# Patient Record
Sex: Female | Born: 1970 | Race: White | Hispanic: No | Marital: Single | Smoking: Never smoker
Health system: Southern US, Community
[De-identification: ages and names within clinical notes are randomized; demographics above are authoritative.]

## PROBLEM LIST (undated history)

## (undated) DIAGNOSIS — Z9889 Other specified postprocedural states: Secondary | ICD-10-CM

## (undated) DIAGNOSIS — I10 Essential (primary) hypertension: Secondary | ICD-10-CM

## (undated) DIAGNOSIS — R011 Cardiac murmur, unspecified: Secondary | ICD-10-CM

## (undated) DIAGNOSIS — C801 Malignant (primary) neoplasm, unspecified: Secondary | ICD-10-CM

## (undated) DIAGNOSIS — K219 Gastro-esophageal reflux disease without esophagitis: Secondary | ICD-10-CM

## (undated) DIAGNOSIS — J45909 Unspecified asthma, uncomplicated: Secondary | ICD-10-CM

## (undated) DIAGNOSIS — M797 Fibromyalgia: Secondary | ICD-10-CM

## (undated) DIAGNOSIS — R51 Headache: Secondary | ICD-10-CM

## (undated) DIAGNOSIS — M5126 Other intervertebral disc displacement, lumbar region: Secondary | ICD-10-CM

## (undated) DIAGNOSIS — R519 Headache, unspecified: Secondary | ICD-10-CM

## (undated) DIAGNOSIS — R112 Nausea with vomiting, unspecified: Secondary | ICD-10-CM

## (undated) DIAGNOSIS — G4730 Sleep apnea, unspecified: Secondary | ICD-10-CM

## (undated) HISTORY — PX: OTHER SURGICAL HISTORY: SHX169

## (undated) HISTORY — PX: TUBAL LIGATION: SHX77

## (undated) HISTORY — PX: COLONOSCOPY: SHX174

## (undated) HISTORY — PX: BACK SURGERY: SHX140

## (undated) HISTORY — PX: ABDOMINAL HYSTERECTOMY: SHX81

## (undated) HISTORY — PX: BILATERAL CARPAL TUNNEL RELEASE: SHX6508

---

## 2012-02-17 ENCOUNTER — Ambulatory Visit: Payer: Self-pay

## 2013-08-22 ENCOUNTER — Ambulatory Visit: Payer: Self-pay | Admitting: Family Medicine

## 2013-10-10 ENCOUNTER — Ambulatory Visit: Payer: Self-pay | Admitting: Physician Assistant

## 2014-02-22 ENCOUNTER — Ambulatory Visit: Payer: Self-pay | Admitting: Physician Assistant

## 2014-05-19 ENCOUNTER — Ambulatory Visit
Admit: 2014-05-19 | Disposition: A | Discharge status: Home / Self Care | Payer: Self-pay | Attending: Family Medicine | Admitting: Family Medicine

## 2015-04-16 ENCOUNTER — Other Ambulatory Visit: Payer: Self-pay | Admitting: Neurology

## 2015-04-16 DIAGNOSIS — M5116 Intervertebral disc disorders with radiculopathy, lumbar region: Secondary | ICD-10-CM

## 2015-04-29 ENCOUNTER — Ambulatory Visit
Admission: RE | Admit: 2015-04-29 | Discharge: 2015-04-29 | Disposition: A | Discharge status: Home / Self Care | Payer: 59 | Source: Ambulatory Visit | Attending: Neurology | Admitting: Neurology

## 2015-04-29 DIAGNOSIS — M5127 Other intervertebral disc displacement, lumbosacral region: Secondary | ICD-10-CM | POA: Insufficient documentation

## 2015-04-29 DIAGNOSIS — M5116 Intervertebral disc disorders with radiculopathy, lumbar region: Secondary | ICD-10-CM | POA: Insufficient documentation

## 2015-04-29 DIAGNOSIS — M545 Low back pain: Secondary | ICD-10-CM | POA: Diagnosis present

## 2015-05-28 ENCOUNTER — Other Ambulatory Visit: Payer: Self-pay | Admitting: Neurosurgery

## 2015-05-28 ENCOUNTER — Other Ambulatory Visit (HOSPITAL_COMMUNITY): Payer: Self-pay | Admitting: Neurosurgery

## 2015-05-28 DIAGNOSIS — M4802 Spinal stenosis, cervical region: Secondary | ICD-10-CM

## 2015-06-12 ENCOUNTER — Ambulatory Visit: Admission: RE | Admit: 2015-06-12 | Payer: 59 | Source: Ambulatory Visit

## 2015-06-12 ENCOUNTER — Ambulatory Visit: Payer: 59

## 2015-06-12 ENCOUNTER — Telehealth: Payer: Self-pay | Admitting: Radiology

## 2016-03-11 ENCOUNTER — Encounter: Payer: Self-pay | Admitting: Emergency Medicine

## 2016-03-11 ENCOUNTER — Ambulatory Visit
Admission: EM | Admit: 2016-03-11 | Discharge: 2016-03-11 | Disposition: A | Discharge status: Home / Self Care | Payer: 59 | Attending: Internal Medicine | Admitting: Internal Medicine

## 2016-03-11 DIAGNOSIS — J019 Acute sinusitis, unspecified: Secondary | ICD-10-CM | POA: Diagnosis not present

## 2016-03-11 DIAGNOSIS — J029 Acute pharyngitis, unspecified: Secondary | ICD-10-CM

## 2016-03-11 HISTORY — DX: Unspecified asthma, uncomplicated: J45.909

## 2016-03-11 HISTORY — DX: Other intervertebral disc displacement, lumbar region: M51.26

## 2016-03-11 HISTORY — DX: Essential (primary) hypertension: I10

## 2016-03-11 LAB — RAPID STREP SCREEN (MED CTR MEBANE ONLY): STREPTOCOCCUS, GROUP A SCREEN (DIRECT): POSITIVE — AB

## 2016-03-11 MED ORDER — CEPHALEXIN 500 MG PO CAPS: 500.0000 mg | ORAL_CAPSULE | Freq: Two times a day (BID) | ORAL | 0 refills | Status: DC

## 2016-03-11 MED ORDER — PREDNISONE 50 MG PO TABS: 50.0000 mg | ORAL_TABLET | Freq: Every day | ORAL | 0 refills | Status: DC

## 2016-03-11 NOTE — Discharge Instructions (Addendum)
Strep swab is pending.  We will contact you if a change in treatment is needed.  Recheck for new fever >100.5, increasing phlegm production/nasal discharge, or if not starting to improve in a few days.  Rest and push fluids.

## 2016-03-11 NOTE — ED Provider Notes (Signed)
[facility]    CSN: [id number] Arrival date & time: 03/11/16  1030     History   Chief Complaint Chief Complaint  Patient presents with  . Sore Throat    HPI Phyllis Park is a 46 y.o. female. Presents today with 4d hx sore throat, post nasal drainage.  Lots of sinus hx, takes mucinex, zyrtec, flonase.  Nose is not runny. No cough. Has chronic headaches. Not achy. No fever. No nausea/vomiting, no diarrhea.  HPI  Past Medical History:  Diagnosis Date  . Asthma   . Herniated lumbar intervertebral disc   . Hypertension     No past surgical history on file.    Home Medications    Prior to Admission medications   Medication Sig Start Date End Date Taking? Authorizing Provider  ALBUTEROL SULFATE HFA IN Inhale into the lungs.   Yes Historical Provider, MD  budesonide-formoterol (SYMBICORT) 160-4.5 MCG/ACT inhaler Inhale 2 puffs into the lungs 2 (two) times daily.   Yes Historical Provider, MD  buPROPion (WELLBUTRIN) 75 MG tablet Take 75 mg by mouth 2 (two) times daily.   Yes Historical Provider, MD  gabapentin (NEURONTIN) 300 MG capsule Take 300 mg by mouth 3 (three) times daily.   Yes Historical Provider, MD  lisinopril (PRINIVIL,ZESTRIL) 10 MG tablet Take 10 mg by mouth daily.   Yes Historical Provider, MD  predniSONE (DELTASONE) 50 MG tablet Take 1 tablet (50 mg total) by mouth daily. 03/11/16   Sherlene Shams, MD    Family History No family history on file.  Social History Social History  Substance Use Topics  . Smoking status: Never Smoker  . Smokeless tobacco: Never Used  . Alcohol use Yes     Comment: occassional     Allergies   Erythromycin; Morphine and related; and Penicillins   Review of Systems Review of Systems  All other systems reviewed and are negative.    Physical Exam Triage Vital Signs ED Triage Vitals  Enc Vitals Group     BP 03/11/16 1056 119/78     Pulse Rate 03/11/16 1056 (!) 101     Resp 03/11/16 1056 16   Temp 03/11/16 1056 98.4 F (36.9 C)     Temp Source 03/11/16 1056 Oral     SpO2 03/11/16 1056 100 %     Weight 03/11/16 1056 135 lb (61.2 kg)     Height 03/11/16 1056 5\' 1"  (1.549 m)     Pain Score 03/11/16 1100 4     Pain Loc --    Updated Vital Signs BP 119/78 (BP Location: Left Arm)   Pulse (!) 101   Temp 98.4 F (36.9 C) (Oral)   Resp 16   Ht 5\' 1"  (1.549 m)   Wt 135 lb (61.2 kg)   SpO2 100%   BMI 25.51 kg/m   Physical Exam  Constitutional: She is oriented to person, place, and time.  Alert, nicely groomed Looks tired, voice sounds congested  HENT:  Head: Atraumatic.  Bilateral TMs are moderately dull, left is red tinged Moderately severe nasal congestion with mucopurulent material present Upper posterior pharynx is somewhat swollen, tonsils are prominent, but pink  Eyes:  Conjugate gaze, no eye redness/drainage  Neck: Neck supple.  Cardiovascular: Normal rate and regular rhythm.   Pulmonary/Chest: No respiratory distress.  Lungs clear, symmetric breath sounds  Abdominal: She exhibits no distension.  Musculoskeletal: Normal range of motion.  No leg swelling  Neurological: She is alert and oriented to person,  place, and time.  Skin: Skin is warm and dry.  No cyanosis  Nursing note and vitals reviewed.    UC Treatments / Results  Labs Results for orders placed or performed during the hospital encounter of 03/11/16  Rapid strep screen  Result Value Ref Range   Streptococcus, Group A Screen (Direct) POSITIVE (A) NEGATIVE    Procedures Procedures (including critical care time) None today  Final Clinical Impressions(s) / UC Diagnoses   Final diagnoses:  Acute sinusitis, recurrence not specified, unspecified location  Acute pharyngitis, unspecified etiology   Strep swab is pending.  We will contact you if a change in treatment is needed.  Recheck for new fever >100.5, increasing phlegm production/nasal discharge, or if not starting to improve in a few  days.  Rest and push fluids.    **strep swab was positive and rx cephalexin sent to pharmacy.  Voicemail left on patient's confirmed phone number.    Meds ordered this encounter  . predniSONE (DELTASONE) 50 MG tablet    Sig: Take 1 tablet (50 mg total) by mouth daily.    Dispense:  3 tablet    Refill:  0  . cephALEXin (KEFLEX) 500 MG capsule    Sig: Take 1 capsule (500 mg total) by mouth 2 (two) times daily.    Dispense:  20 capsule    Refill:  0      Sherlene Shams, MD 03/12/16 2141

## 2016-03-11 NOTE — ED Triage Notes (Signed)
Pt reports sore throat with swelling denies known fever reports has had some sinus drainage

## 2016-05-01 ENCOUNTER — Encounter: Payer: Self-pay | Admitting: Gynecology

## 2016-05-01 ENCOUNTER — Ambulatory Visit
Admission: EM | Admit: 2016-05-01 | Discharge: 2016-05-01 | Disposition: A | Discharge status: Home / Self Care | Payer: 59 | Attending: Family Medicine | Admitting: Family Medicine

## 2016-05-01 DIAGNOSIS — J0140 Acute pansinusitis, unspecified: Secondary | ICD-10-CM

## 2016-05-01 MED ORDER — LEVOFLOXACIN 500 MG PO TABS: 500.0000 mg | ORAL_TABLET | Freq: Every day | ORAL | 0 refills | Status: AC

## 2016-05-01 NOTE — ED Provider Notes (Signed)
CSN: [id number]     Arrival date & time 05/01/16  1209 History   First MD Initiated Contact with Patient 05/01/16 1228     Chief Complaint  Patient presents with  . Sinusitis   (Consider location/radiation/quality/duration/timing/severity/associated sxs/prior Treatment) 46 year old female presents with nasal congestion, sinus pressure, headache, and fatigue for the past 4 to 5 days. Now experiencing more cough and congestion. Denies any fever or GI symptoms. Has history of recurrent allergies, asthma and sinus problems for many years. Was seen about 1 month ago with strep and took Keflex with success. She has been taking Mucinex, Nyquil and Zyrtec with minimal relief now.    The history is provided by the patient.    Past Medical History:  Diagnosis Date  . Asthma   . Herniated lumbar intervertebral disc   . Hypertension    Past Surgical History:  Procedure Laterality Date  . ABDOMINAL HYSTERECTOMY    . BILATERAL CARPAL TUNNEL RELEASE    . bladder tact     No family history on file. Social History  Substance Use Topics  . Smoking status: Never Smoker  . Smokeless tobacco: Never Used  . Alcohol use Yes     Comment: occassional   OB History    No data available     Review of Systems  Constitutional: Positive for fatigue. Negative for activity change, appetite change, chills and fever.  HENT: Positive for congestion, postnasal drip, sinus pain, sinus pressure and sore throat. Negative for ear discharge, ear pain and facial swelling.   Eyes: Negative for discharge.  Respiratory: Positive for cough. Negative for chest tightness, shortness of breath and wheezing.   Cardiovascular: Negative for chest pain.  Gastrointestinal: Negative for abdominal pain, diarrhea, nausea and vomiting.  Musculoskeletal: Negative for arthralgias, back pain, myalgias, neck pain and neck stiffness.  Skin: Negative for rash and wound.  Neurological: Positive for headaches. Negative for dizziness,  syncope, weakness and light-headedness.  Hematological: Negative for adenopathy.    Allergies  Erythromycin; Morphine and related; and Penicillins  Home Medications   Prior to Admission medications   Medication Sig Start Date End Date Taking? Authorizing Provider  ALBUTEROL SULFATE HFA IN Inhale into the lungs.   Yes Historical Provider, MD  budesonide-formoterol (SYMBICORT) 160-4.5 MCG/ACT inhaler Inhale 2 puffs into the lungs 2 (two) times daily.   Yes Historical Provider, MD  buPROPion (WELLBUTRIN) 75 MG tablet Take 75 mg by mouth 2 (two) times daily.   Yes Historical Provider, MD  gabapentin (NEURONTIN) 300 MG capsule Take 300 mg by mouth 3 (three) times daily.   Yes Historical Provider, MD  lisinopril (PRINIVIL,ZESTRIL) 10 MG tablet Take 10 mg by mouth daily.   Yes Historical Provider, MD  levofloxacin (LEVAQUIN) 500 MG tablet Take 1 tablet (500 mg total) by mouth daily. 05/01/16 05/08/16  Katy Apo, NP   Meds Ordered and Administered this Visit  Medications - No data to display  BP 123/75   Pulse (!) 103   Temp 98.5 F (36.9 C) (Oral)   Resp 16   Wt 135 lb (61.2 kg)   SpO2 97%   BMI 25.51 kg/m  No data found.   Physical Exam  Constitutional: She is oriented to person, place, and time. She appears well-developed and well-nourished. She appears ill. No distress.  Appears tired.  HENT:  Head: Normocephalic and atraumatic.  Right Ear: Hearing, tympanic membrane, external ear and ear canal normal.  Left Ear: Hearing, tympanic membrane, external ear and  ear canal normal.  Nose: Mucosal edema and rhinorrhea present. Right sinus exhibits maxillary sinus tenderness and frontal sinus tenderness. Left sinus exhibits maxillary sinus tenderness and frontal sinus tenderness.  Mouth/Throat: Uvula is midline and mucous membranes are normal. Posterior oropharyngeal erythema present.  Neck: Normal range of motion. Neck supple.  Cardiovascular: Regular rhythm and normal heart sounds.   Bradycardia present.   Pulmonary/Chest: Effort normal and breath sounds normal. No respiratory distress. She has no decreased breath sounds. She has no wheezes. She has no rhonchi.  Musculoskeletal: Normal range of motion.  Lymphadenopathy:    She has no cervical adenopathy.  Neurological: She is alert and oriented to person, place, and time.  Skin: Skin is warm and dry. Capillary refill takes less than 2 seconds.  Psychiatric: She has a normal mood and affect. Her behavior is normal. Judgment and thought content normal.    Urgent Care Course     Procedures (including critical care time)  Labs Review Labs Reviewed - No data to display  Imaging Review No results found.   Visual Acuity Review  Right Eye Distance:   Left Eye Distance:   Bilateral Distance:    Right Eye Near:   Left Eye Near:    Bilateral Near:         MDM   1. Acute non-recurrent pansinusitis    Discussed various treatment options with history of multiple allergies- recommend trial Levaquin 500mg  1 tablet daily. Continue Mucinex and Zyrtec as directed. Also continue Albuterol and Symbicort as needed for cough. Increase fluid intake to help loosen up mucus. Follow-up with her primary care provider in 3 to 4 days if not improving.      Katy Apo, NP 05/01/16 1324

## 2016-05-01 NOTE — Discharge Instructions (Signed)
Recommend start Levaquin daily as directed. Continue Mucinex and Zyrtec as well as your inhalers as needed. Increase fluid intake to help loosen up mucus. Follow-up with your primary care provider in 3 to 4 days if not improving.

## 2016-05-01 NOTE — ED Triage Notes (Signed)
Patient c/o sinus drainage x 4 days.

## 2016-06-23 ENCOUNTER — Other Ambulatory Visit: Payer: Self-pay | Admitting: Neurosurgery

## 2016-08-03 NOTE — Pre-Procedure Instructions (Signed)
    Antonieta Slaven  08/03/2016      [facility] [address] [place] [address] [facility] [place] [address] Phone: [phone] Fax: [phone]    Your procedure is scheduled on July 6th, Friday.              Report to [facility] Admitting at 6:00 AM             (posted surgery time 8:00 - 10:48am) .  Call this number if you have problems the morning of surgery:  [phone], for other questions, call [phone] Mon-Fri from 8a - 4p.   Remember:   Do not eat food or drink liquids after midnight Thursday.               4-5 days prior to surgery, STOP TAKING any Vitamins, Herbal Supplements, Anti-flammatories.   Take these medicines the morning of surgery with A SIP OF WATER : Nexium, Neurontin, Hydrocodone. Please use your inhalers that morning.   Do not wear jewelry, make-up or nail polish.  Do not wear lotions, powders,  perfumes, or deoderant.  Do not shave 48 hours prior to surgery.     Do not bring valuables to the hospital.  [facility] is not responsible for any belongings or valuables.  Contacts, dentures or bridgework may not be worn into surgery.  Leave your suitcase in the car.  After surgery it may be brought to your room.  For patients admitted to the hospital, discharge time will be determined by your treatment team.  Please read over the following fact sheets that you were given. Pain Booklet, MRSA Information and Surgical Site Infection Prevention

## 2016-08-04 ENCOUNTER — Encounter (HOSPITAL_COMMUNITY)
Admission: RE | Admit: 2016-08-04 | Discharge: 2016-08-04 | Disposition: A | Discharge status: Home / Self Care | Payer: 59 | Source: Ambulatory Visit | Attending: Neurosurgery | Admitting: Neurosurgery

## 2016-08-04 ENCOUNTER — Encounter (HOSPITAL_COMMUNITY): Payer: Self-pay

## 2016-08-04 DIAGNOSIS — M5136 Other intervertebral disc degeneration, lumbar region: Secondary | ICD-10-CM | POA: Insufficient documentation

## 2016-08-04 DIAGNOSIS — Z01810 Encounter for preprocedural cardiovascular examination: Secondary | ICD-10-CM | POA: Insufficient documentation

## 2016-08-04 HISTORY — DX: Headache, unspecified: R51.9

## 2016-08-04 HISTORY — DX: Other specified postprocedural states: Z98.890

## 2016-08-04 HISTORY — DX: Cardiac murmur, unspecified: R01.1

## 2016-08-04 HISTORY — DX: Headache: R51

## 2016-08-04 HISTORY — DX: Sleep apnea, unspecified: G47.30

## 2016-08-04 HISTORY — DX: Gastro-esophageal reflux disease without esophagitis: K21.9

## 2016-08-04 HISTORY — DX: Nausea with vomiting, unspecified: R11.2

## 2016-08-04 HISTORY — DX: Fibromyalgia: M79.7

## 2016-08-04 LAB — CBC WITH DIFFERENTIAL/PLATELET
Basophils Absolute: 0 10*3/uL (ref 0.0–0.1)
Basophils Relative: 0 %
Eosinophils Absolute: 0 10*3/uL (ref 0.0–0.7)
Eosinophils Relative: 1 %
HEMATOCRIT: 39.3 % (ref 36.0–46.0)
HEMOGLOBIN: 12.9 g/dL (ref 12.0–15.0)
LYMPHS PCT: 46 %
Lymphs Abs: 1.9 10*3/uL (ref 0.7–4.0)
MCH: 29.3 pg (ref 26.0–34.0)
MCHC: 32.8 g/dL (ref 30.0–36.0)
MCV: 89.1 fL (ref 78.0–100.0)
MONOS PCT: 7 %
Monocytes Absolute: 0.3 10*3/uL (ref 0.1–1.0)
NEUTROS ABS: 1.9 10*3/uL (ref 1.7–7.7)
NEUTROS PCT: 46 %
Platelets: 153 10*3/uL (ref 150–400)
RBC: 4.41 MIL/uL (ref 3.87–5.11)
RDW: 12.5 % (ref 11.5–15.5)
WBC: 4.1 10*3/uL (ref 4.0–10.5)

## 2016-08-04 LAB — BASIC METABOLIC PANEL
Anion gap: 6 (ref 5–15)
BUN: 8 mg/dL (ref 6–20)
CALCIUM: 9.1 mg/dL (ref 8.9–10.3)
CO2: 28 mmol/L (ref 22–32)
CREATININE: 0.82 mg/dL (ref 0.44–1.00)
Chloride: 102 mmol/L (ref 101–111)
Glucose, Bld: 78 mg/dL (ref 65–99)
Potassium: 3.7 mmol/L (ref 3.5–5.1)
Sodium: 136 mmol/L (ref 135–145)

## 2016-08-04 LAB — SURGICAL PCR SCREEN
MRSA, PCR: NEGATIVE
STAPHYLOCOCCUS AUREUS: NEGATIVE

## 2016-08-04 LAB — TYPE AND SCREEN
ABO/RH(D): A POS
Antibody Screen: NEGATIVE

## 2016-08-04 LAB — ABO/RH: ABO/RH(D): A POS

## 2016-08-04 NOTE — Progress Notes (Signed)
PCP is Dr. 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  And was told that she had heart murmur.  States will have some palpitations from time to time ..."but since being on medicine, it hasn't really bothered me" Has not seen or been to a cardiologist.   Takes botox injections for migraines - works well.

## 2016-08-04 NOTE — Progress Notes (Signed)
   08/04/16 1522  OBSTRUCTIVE SLEEP APNEA  Have you ever been diagnosed with sleep apnea through a sleep study? No  Do you snore loudly (loud enough to be heard through closed doors)?  1  Do you often feel tired, fatigued, or sleepy during the daytime (such as falling asleep during driving or talking to someone)? 1  Has anyone observed you stop breathing during your sleep? 1  Do you have, or are you being treated for high blood pressure? 1  BMI more than 35 kg/m2? 0  Age > 50 (1-yes) 0  Neck circumference greater than:Female 16 inches or larger, Female 17inches or larger? 0  Female Gender (Yes=1) 0  Obstructive Sleep Apnea Score 4  Score 5 or greater  Results sent to PCP

## 2016-08-05 NOTE — Progress Notes (Signed)
Anesthesia Chart Review:  Pt is a 46 year old female scheduled for L5-S1 PLIF on 08/12/2016 with Earnie Larsson, M.D.  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  HTN, OSA, asthma, post-op  N/V, GERD. Never smoker. BMI 26  Medications include: Albuterol, Symbicort, Nexium, lisinopril  Preoperative labs reviewed.  EKG 08/04/16: NSR  If no changes, I anticipate pt can proceed with surgery as scheduled.   Willeen Cass, FNP-BC [facility]/Anesthesiology Phone: [phone] 08/05/2016 2:47 PM

## 2016-08-12 ENCOUNTER — Inpatient Hospital Stay (HOSPITAL_COMMUNITY): Payer: 59 | Admitting: Anesthesiology

## 2016-08-12 ENCOUNTER — Inpatient Hospital Stay (HOSPITAL_COMMUNITY): Payer: 59 | Admitting: Emergency Medicine

## 2016-08-12 ENCOUNTER — Inpatient Hospital Stay (HOSPITAL_COMMUNITY): Payer: 59

## 2016-08-12 ENCOUNTER — Encounter (HOSPITAL_COMMUNITY): Payer: Self-pay | Admitting: *Deleted

## 2016-08-12 ENCOUNTER — Inpatient Hospital Stay (HOSPITAL_COMMUNITY)
Admission: RE | Disposition: A | Discharge status: Home / Self Care | Payer: Self-pay | Source: Ambulatory Visit | Attending: Neurosurgery

## 2016-08-12 ENCOUNTER — Inpatient Hospital Stay (HOSPITAL_COMMUNITY)
Admission: RE | Admit: 2016-08-12 | Discharge: 2016-08-13 | DRG: 455 | Disposition: A | Discharge status: Home / Self Care | Payer: 59 | Source: Ambulatory Visit | Attending: Neurosurgery | Admitting: Neurosurgery

## 2016-08-12 DIAGNOSIS — M4317 Spondylolisthesis, lumbosacral region: Secondary | ICD-10-CM | POA: Diagnosis present

## 2016-08-12 DIAGNOSIS — Z79899 Other long term (current) drug therapy: Secondary | ICD-10-CM | POA: Diagnosis not present

## 2016-08-12 DIAGNOSIS — M48061 Spinal stenosis, lumbar region without neurogenic claudication: Secondary | ICD-10-CM | POA: Diagnosis present

## 2016-08-12 DIAGNOSIS — I10 Essential (primary) hypertension: Secondary | ICD-10-CM | POA: Diagnosis present

## 2016-08-12 DIAGNOSIS — M4310 Spondylolisthesis, site unspecified: Secondary | ICD-10-CM | POA: Diagnosis present

## 2016-08-12 DIAGNOSIS — Z7951 Long term (current) use of inhaled steroids: Secondary | ICD-10-CM | POA: Diagnosis not present

## 2016-08-12 DIAGNOSIS — K219 Gastro-esophageal reflux disease without esophagitis: Secondary | ICD-10-CM | POA: Diagnosis present

## 2016-08-12 DIAGNOSIS — M5116 Intervertebral disc disorders with radiculopathy, lumbar region: Secondary | ICD-10-CM | POA: Diagnosis present

## 2016-08-12 DIAGNOSIS — M797 Fibromyalgia: Secondary | ICD-10-CM | POA: Diagnosis present

## 2016-08-12 DIAGNOSIS — Z880 Allergy status to penicillin: Secondary | ICD-10-CM

## 2016-08-12 DIAGNOSIS — M5117 Intervertebral disc disorders with radiculopathy, lumbosacral region: Secondary | ICD-10-CM | POA: Diagnosis present

## 2016-08-12 DIAGNOSIS — Z419 Encounter for procedure for purposes other than remedying health state, unspecified: Secondary | ICD-10-CM

## 2016-08-12 SURGERY — POSTERIOR LUMBAR FUSION 1 LEVEL
Anesthesia: General | Site: Back

## 2016-08-12 MED ORDER — BUPROPION HCL 75 MG PO TABS
75.0000 mg | ORAL_TABLET | Freq: Two times a day (BID) | ORAL | Status: DC
  Administered 2016-08-12 – 2016-08-13 (×3): 75 mg via ORAL
  Filled 2016-08-12 (×3): qty 1

## 2016-08-12 MED ORDER — ONDANSETRON HCL 4 MG/2ML IJ SOLN
INTRAMUSCULAR | Status: DC | PRN
  Administered 2016-08-12: 4 mg via INTRAVENOUS

## 2016-08-12 MED ORDER — MOMETASONE FURO-FORMOTEROL FUM 200-5 MCG/ACT IN AERO
2.0000 | INHALATION_SPRAY | Freq: Two times a day (BID) | RESPIRATORY_TRACT | Status: DC
  Administered 2016-08-12: 2 via RESPIRATORY_TRACT
  Filled 2016-08-12: qty 8.8

## 2016-08-12 MED ORDER — PHENYLEPHRINE HCL 10 MG/ML IJ SOLN
INTRAMUSCULAR | Status: DC | PRN
  Administered 2016-08-12 (×2): 80 ug via INTRAVENOUS

## 2016-08-12 MED ORDER — HYDROMORPHONE HCL 1 MG/ML IJ SOLN
0.2500 mg | INTRAMUSCULAR | Status: DC | PRN
  Administered 2016-08-12: 0.5 mg via INTRAVENOUS

## 2016-08-12 MED ORDER — SODIUM CHLORIDE 0.9% FLUSH
3.0000 mL | Freq: Two times a day (BID) | INTRAVENOUS | Status: DC
  Administered 2016-08-12: 3 mL via INTRAVENOUS

## 2016-08-12 MED ORDER — OXYCODONE HCL 5 MG PO TABS
5.0000 mg | ORAL_TABLET | Freq: Once | ORAL | Status: AC | PRN
  Administered 2016-08-12: 5 mg via ORAL

## 2016-08-12 MED ORDER — PANTOPRAZOLE SODIUM 40 MG PO TBEC
40.0000 mg | DELAYED_RELEASE_TABLET | Freq: Every day | ORAL | Status: DC
  Administered 2016-08-13: 40 mg via ORAL
  Filled 2016-08-12: qty 1

## 2016-08-12 MED ORDER — SCOPOLAMINE 1 MG/3DAYS TD PT72
1.0000 | MEDICATED_PATCH | Freq: Once | TRANSDERMAL | Status: AC
  Administered 2016-08-12: 1 via TRANSDERMAL
  Administered 2016-08-12: 1.5 mg via TRANSDERMAL
  Filled 2016-08-12: qty 1

## 2016-08-12 MED ORDER — VANCOMYCIN HCL IN DEXTROSE 1-5 GM/200ML-% IV SOLN
1000.0000 mg | Freq: Once | INTRAVENOUS | Status: AC
  Administered 2016-08-12: 1000 mg via INTRAVENOUS
  Filled 2016-08-12: qty 200

## 2016-08-12 MED ORDER — VITAMIN D (ERGOCALCIFEROL) 1.25 MG (50000 UNIT) PO CAPS: 50000.0000 [IU] | ORAL_CAPSULE | ORAL | Status: DC

## 2016-08-12 MED ORDER — VANCOMYCIN HCL 1000 MG IV SOLR
INTRAVENOUS | Status: AC
  Filled 2016-08-12: qty 1000

## 2016-08-12 MED ORDER — DEXAMETHASONE SODIUM PHOSPHATE 10 MG/ML IJ SOLN
10.0000 mg | INTRAMUSCULAR | Status: AC
  Administered 2016-08-12: 10 mg via INTRAVENOUS
  Filled 2016-08-12: qty 1

## 2016-08-12 MED ORDER — THROMBIN 20000 UNITS EX SOLR
CUTANEOUS | Status: AC
  Filled 2016-08-12: qty 20000

## 2016-08-12 MED ORDER — VANCOMYCIN HCL IN DEXTROSE 1-5 GM/200ML-% IV SOLN
1000.0000 mg | INTRAVENOUS | Status: AC
  Administered 2016-08-12: 1000 mg via INTRAVENOUS
  Filled 2016-08-12: qty 200

## 2016-08-12 MED ORDER — LISINOPRIL 20 MG PO TABS
10.0000 mg | ORAL_TABLET | Freq: Every day | ORAL | Status: DC
  Administered 2016-08-12: 10 mg via ORAL
  Filled 2016-08-12: qty 1

## 2016-08-12 MED ORDER — CHLORHEXIDINE GLUCONATE CLOTH 2 % EX PADS: 6.0000 | MEDICATED_PAD | Freq: Once | CUTANEOUS | Status: DC

## 2016-08-12 MED ORDER — HYDROMORPHONE HCL 1 MG/ML IJ SOLN
INTRAMUSCULAR | Status: AC
  Filled 2016-08-12: qty 0.5

## 2016-08-12 MED ORDER — FENTANYL CITRATE (PF) 100 MCG/2ML IJ SOLN
INTRAMUSCULAR | Status: DC | PRN
  Administered 2016-08-12: 100 ug via INTRAVENOUS
  Administered 2016-08-12 (×2): 50 ug via INTRAVENOUS

## 2016-08-12 MED ORDER — SODIUM CHLORIDE 0.9 % IR SOLN
Status: DC | PRN
  Administered 2016-08-12: 08:00:00

## 2016-08-12 MED ORDER — SUGAMMADEX SODIUM 200 MG/2ML IV SOLN
INTRAVENOUS | Status: DC | PRN
  Administered 2016-08-12: 125 mg via INTRAVENOUS

## 2016-08-12 MED ORDER — GABAPENTIN 300 MG PO CAPS
300.0000 mg | ORAL_CAPSULE | Freq: Three times a day (TID) | ORAL | Status: DC
  Administered 2016-08-12 – 2016-08-13 (×3): 300 mg via ORAL
  Filled 2016-08-12 (×3): qty 1

## 2016-08-12 MED ORDER — OXYCODONE HCL 5 MG/5ML PO SOLN: 5.0000 mg | Freq: Once | ORAL | Status: AC | PRN

## 2016-08-12 MED ORDER — OXYCODONE HCL 5 MG PO TABS
5.0000 mg | ORAL_TABLET | ORAL | Status: DC | PRN
  Administered 2016-08-12 – 2016-08-13 (×6): 10 mg via ORAL
  Filled 2016-08-12 (×6): qty 2

## 2016-08-12 MED ORDER — PHENOL 1.4 % MT LIQD: 1.0000 | OROMUCOSAL | Status: DC | PRN

## 2016-08-12 MED ORDER — ONDANSETRON HCL 4 MG/2ML IJ SOLN: 4.0000 mg | Freq: Four times a day (QID) | INTRAMUSCULAR | Status: DC | PRN

## 2016-08-12 MED ORDER — PROPOFOL 10 MG/ML IV BOLUS
INTRAVENOUS | Status: AC
  Filled 2016-08-12: qty 20

## 2016-08-12 MED ORDER — PHENYLEPHRINE 40 MCG/ML (10ML) SYRINGE FOR IV PUSH (FOR BLOOD PRESSURE SUPPORT)
PREFILLED_SYRINGE | INTRAVENOUS | Status: AC
  Filled 2016-08-12: qty 10

## 2016-08-12 MED ORDER — OXYCODONE HCL 5 MG PO TABS
ORAL_TABLET | ORAL | Status: AC
  Filled 2016-08-12: qty 1

## 2016-08-12 MED ORDER — LIDOCAINE HCL (CARDIAC) 20 MG/ML IV SOLN
INTRAVENOUS | Status: AC
  Filled 2016-08-12: qty 5

## 2016-08-12 MED ORDER — BUPIVACAINE HCL (PF) 0.25 % IJ SOLN
INTRAMUSCULAR | Status: AC
  Filled 2016-08-12: qty 30

## 2016-08-12 MED ORDER — MIDAZOLAM HCL 5 MG/5ML IJ SOLN
INTRAMUSCULAR | Status: DC | PRN
  Administered 2016-08-12: 2 mg via INTRAVENOUS

## 2016-08-12 MED ORDER — FENTANYL CITRATE (PF) 250 MCG/5ML IJ SOLN
INTRAMUSCULAR | Status: AC
Start: 2016-08-12 — End: 2016-08-12
  Filled 2016-08-12: qty 5

## 2016-08-12 MED ORDER — VITAMIN B-12 1000 MCG PO TABS
2000.0000 ug | ORAL_TABLET | Freq: Every day | ORAL | Status: DC
  Administered 2016-08-12: 2000 ug via ORAL
  Filled 2016-08-12: qty 2

## 2016-08-12 MED ORDER — LACTATED RINGERS IV SOLN
INTRAVENOUS | Status: DC | PRN
  Administered 2016-08-12 (×3): via INTRAVENOUS

## 2016-08-12 MED ORDER — SODIUM CHLORIDE 0.9 % IV SOLN: 250.0000 mL | INTRAVENOUS | Status: DC

## 2016-08-12 MED ORDER — DIAZEPAM 5 MG PO TABS
5.0000 mg | ORAL_TABLET | Freq: Four times a day (QID) | ORAL | Status: DC | PRN
  Administered 2016-08-12 (×2): 5 mg via ORAL
  Administered 2016-08-13: 10 mg via ORAL
  Administered 2016-08-13: 5 mg via ORAL
  Filled 2016-08-12: qty 2
  Filled 2016-08-12: qty 1
  Filled 2016-08-12: qty 2
  Filled 2016-08-12: qty 1

## 2016-08-12 MED ORDER — ALBUTEROL SULFATE (2.5 MG/3ML) 0.083% IN NEBU: 3.0000 mL | INHALATION_SOLUTION | RESPIRATORY_TRACT | Status: DC | PRN

## 2016-08-12 MED ORDER — CYCLOBENZAPRINE HCL 10 MG PO TABS
10.0000 mg | ORAL_TABLET | Freq: Three times a day (TID) | ORAL | Status: DC | PRN
  Administered 2016-08-12 – 2016-08-13 (×3): 10 mg via ORAL
  Filled 2016-08-12 (×3): qty 1

## 2016-08-12 MED ORDER — DIAZEPAM 5 MG PO TABS
ORAL_TABLET | ORAL | Status: AC
  Filled 2016-08-12: qty 1

## 2016-08-12 MED ORDER — MIDAZOLAM HCL 2 MG/2ML IJ SOLN
INTRAMUSCULAR | Status: AC
  Filled 2016-08-12: qty 2

## 2016-08-12 MED ORDER — DEXTROSE 5 % IV SOLN
INTRAVENOUS | Status: DC | PRN
  Administered 2016-08-12: 50 ug/min via INTRAVENOUS

## 2016-08-12 MED ORDER — VANCOMYCIN HCL 1000 MG IV SOLR
INTRAVENOUS | Status: DC | PRN
  Administered 2016-08-12: 1000 mg via TOPICAL

## 2016-08-12 MED ORDER — SCOPOLAMINE 1 MG/3DAYS TD PT72
MEDICATED_PATCH | TRANSDERMAL | Status: AC
  Filled 2016-08-12: qty 1

## 2016-08-12 MED ORDER — LIDOCAINE HCL (CARDIAC) 20 MG/ML IV SOLN
INTRAVENOUS | Status: DC | PRN
  Administered 2016-08-12: 60 mg via INTRAVENOUS

## 2016-08-12 MED ORDER — HYDROMORPHONE HCL 1 MG/ML IJ SOLN
0.5000 mg | INTRAMUSCULAR | Status: DC | PRN
  Administered 2016-08-12: 1 mg via INTRAVENOUS
  Filled 2016-08-12: qty 1

## 2016-08-12 MED ORDER — MENTHOL 3 MG MT LOZG: 1.0000 | LOZENGE | OROMUCOSAL | Status: DC | PRN

## 2016-08-12 MED ORDER — BUPIVACAINE HCL (PF) 0.25 % IJ SOLN
INTRAMUSCULAR | Status: DC | PRN
  Administered 2016-08-12: 20 mL

## 2016-08-12 MED ORDER — PROPOFOL 10 MG/ML IV BOLUS
INTRAVENOUS | Status: DC | PRN
  Administered 2016-08-12: 150 mg via INTRAVENOUS

## 2016-08-12 MED ORDER — ROCURONIUM BROMIDE 100 MG/10ML IV SOLN
INTRAVENOUS | Status: DC | PRN
  Administered 2016-08-12: 10 mg via INTRAVENOUS
  Administered 2016-08-12: 50 mg via INTRAVENOUS

## 2016-08-12 MED ORDER — 0.9 % SODIUM CHLORIDE (POUR BTL) OPTIME
TOPICAL | Status: DC | PRN
  Administered 2016-08-12: 1000 mL

## 2016-08-12 MED ORDER — THROMBIN 20000 UNITS EX SOLR
CUTANEOUS | Status: DC | PRN
  Administered 2016-08-12: 08:00:00 via TOPICAL

## 2016-08-12 MED ORDER — VITAMIN E 180 MG (400 UNIT) PO CAPS
800.0000 [IU] | ORAL_CAPSULE | Freq: Every day | ORAL | Status: DC
  Administered 2016-08-12: 800 [IU] via ORAL
  Filled 2016-08-12: qty 2

## 2016-08-12 MED ORDER — ONDANSETRON HCL 4 MG PO TABS: 4.0000 mg | ORAL_TABLET | Freq: Four times a day (QID) | ORAL | Status: DC | PRN

## 2016-08-12 MED ORDER — SODIUM CHLORIDE 0.9% FLUSH: 3.0000 mL | INTRAVENOUS | Status: DC | PRN

## 2016-08-12 SURGICAL SUPPLY — 64 items
BAG DECANTER FOR FLEXI CONT (MISCELLANEOUS) ×6 IMPLANT
BENZOIN TINCTURE PRP APPL 2/3 (GAUZE/BANDAGES/DRESSINGS) ×3 IMPLANT
BLADE CLIPPER SURG (BLADE) IMPLANT
BUR CUTTER 7.0 ROUND (BURR) IMPLANT
BUR MATCHSTICK NEURO 3.0 LAGG (BURR) ×3 IMPLANT
CANISTER SUCT 3000ML PPV (MISCELLANEOUS) ×3 IMPLANT
CAP LCK SPNE (Orthopedic Implant) ×4 IMPLANT
CAP LOCK SPINE RADIUS (Orthopedic Implant) ×4 IMPLANT
CAP LOCKING (Orthopedic Implant) ×8 IMPLANT
CARTRIDGE OIL MAESTRO DRILL (MISCELLANEOUS) ×1 IMPLANT
CLOSURE WOUND 1/2 X4 (GAUZE/BANDAGES/DRESSINGS) ×2
CONT SPEC 4OZ CLIKSEAL STRL BL (MISCELLANEOUS) ×3 IMPLANT
COVER BACK TABLE 60X90IN (DRAPES) ×3 IMPLANT
DECANTER SPIKE VIAL GLASS SM (MISCELLANEOUS) ×3 IMPLANT
DERMABOND ADVANCED (GAUZE/BANDAGES/DRESSINGS) ×2
DERMABOND ADVANCED .7 DNX12 (GAUZE/BANDAGES/DRESSINGS) ×1 IMPLANT
DEVICE INTERBODY ELEVATE 23X8 (Cage) ×4 IMPLANT
DIFFUSER DRILL AIR PNEUMATIC (MISCELLANEOUS) ×3 IMPLANT
DRAPE C-ARM 42X72 X-RAY (DRAPES) ×6 IMPLANT
DRAPE HALF SHEET 40X57 (DRAPES) IMPLANT
DRAPE LAPAROTOMY 100X72X124 (DRAPES) ×3 IMPLANT
DRAPE POUCH INSTRU U-SHP 10X18 (DRAPES) ×3 IMPLANT
DRAPE SURG 17X23 STRL (DRAPES) ×12 IMPLANT
DRSG OPSITE POSTOP 4X6 (GAUZE/BANDAGES/DRESSINGS) ×3 IMPLANT
DURAPREP 26ML APPLICATOR (WOUND CARE) ×3 IMPLANT
ELECT REM PT RETURN 9FT ADLT (ELECTROSURGICAL) ×3
ELECTRODE REM PT RTRN 9FT ADLT (ELECTROSURGICAL) ×1 IMPLANT
EVACUATOR 1/8 PVC DRAIN (DRAIN) ×3 IMPLANT
GAUZE SPONGE 4X4 12PLY STRL (GAUZE/BANDAGES/DRESSINGS) IMPLANT
GAUZE SPONGE 4X4 16PLY XRAY LF (GAUZE/BANDAGES/DRESSINGS) IMPLANT
GLOVE ECLIPSE 9.0 STRL (GLOVE) ×6 IMPLANT
GLOVE EXAM NITRILE LRG STRL (GLOVE) IMPLANT
GLOVE EXAM NITRILE XL STR (GLOVE) IMPLANT
GLOVE EXAM NITRILE XS STR PU (GLOVE) IMPLANT
GOWN STRL REUS W/ TWL LRG LVL3 (GOWN DISPOSABLE) IMPLANT
GOWN STRL REUS W/ TWL XL LVL3 (GOWN DISPOSABLE) ×2 IMPLANT
GOWN STRL REUS W/TWL 2XL LVL3 (GOWN DISPOSABLE) IMPLANT
GOWN STRL REUS W/TWL LRG LVL3 (GOWN DISPOSABLE)
GOWN STRL REUS W/TWL XL LVL3 (GOWN DISPOSABLE) ×4
GRAFT BN 5X1XSPNE CVD POST DBM (Bone Implant) ×1 IMPLANT
GRAFT BONE MAGNIFUSE 1X5CM (Bone Implant) ×2 IMPLANT
KIT BASIN OR (CUSTOM PROCEDURE TRAY) ×3 IMPLANT
KIT ROOM TURNOVER OR (KITS) ×3 IMPLANT
MILL MEDIUM DISP (BLADE) ×3 IMPLANT
NEEDLE HYPO 22GX1.5 SAFETY (NEEDLE) ×3 IMPLANT
NS IRRIG 1000ML POUR BTL (IV SOLUTION) ×3 IMPLANT
OIL CARTRIDGE MAESTRO DRILL (MISCELLANEOUS) ×3
PACK LAMINECTOMY NEURO (CUSTOM PROCEDURE TRAY) ×3 IMPLANT
ROD RADIUS 40MM (Neuro Prosthesis/Implant) ×4 IMPLANT
ROD SPNL 40X5.5XNS TI RDS (Neuro Prosthesis/Implant) ×2 IMPLANT
SCREW 5.75 X 635 (Screw) ×3 IMPLANT
SCREW 5.75X40M (Screw) ×9 IMPLANT
SPACER SPNL STD 23X8XSTRL (Cage) ×2 IMPLANT
SPCR SPNL STD 23X8XSTRL (Cage) ×2 IMPLANT
SPONGE SURGIFOAM ABS GEL 100 (HEMOSTASIS) ×3 IMPLANT
STRIP CLOSURE SKIN 1/2X4 (GAUZE/BANDAGES/DRESSINGS) ×4 IMPLANT
SUT VIC AB 0 CT1 18XCR BRD8 (SUTURE) ×2 IMPLANT
SUT VIC AB 0 CT1 8-18 (SUTURE) ×4
SUT VIC AB 2-0 CT1 18 (SUTURE) ×3 IMPLANT
SUT VIC AB 3-0 SH 8-18 (SUTURE) ×6 IMPLANT
TOWEL GREEN STERILE (TOWEL DISPOSABLE) ×3 IMPLANT
TOWEL GREEN STERILE FF (TOWEL DISPOSABLE) ×3 IMPLANT
TRAY FOLEY W/METER SILVER 16FR (SET/KITS/TRAYS/PACK) ×3 IMPLANT
WATER STERILE IRR 1000ML POUR (IV SOLUTION) ×3 IMPLANT

## 2016-08-12 NOTE — H&P (Signed)
Phyllis Park is an 46 y.o. female.   Chief Complaint: Back pain HPI: 46 year old female with chronic progressive back pain and radiation into both lower extremities left greater than right. Workup demonstrates evidence of progressive disc degeneration with retrolisthesis and foraminal stenosis at L5-S1. Patient presents now for L5-S1 decompression and fusion.  Past Medical History:  Diagnosis Date  . Asthma   . Fibromyalgia   . GERD (gastroesophageal reflux disease)   . Headache    migraines  . Heart murmur   . Herniated lumbar intervertebral disc   . Hypertension   . PONV (postoperative nausea and vomiting)   . Sleep apnea    not diagnosed via a study    Past Surgical History:  Procedure Laterality Date  . ABDOMINAL HYSTERECTOMY    . BILATERAL CARPAL TUNNEL RELEASE    . bladder tact    . TUBAL LIGATION      No family history on file. Social History:  reports that she has never smoked. She has never used smokeless tobacco. She reports that she drinks alcohol. She reports that she does not use drugs.  Allergies:  Allergies  Allergen Reactions  . Penicillins     Has patient had a PCN reaction causing immediate rash, facial/tongue/throat swelling, SOB or lightheadedness with hypotension:Yes Has patient had a PCN reaction causing severe rash involving mucus membranes or skin necrosis:Yes Has patient had a PCN reaction that required hospitalization: No Has patient had a PCN reaction occurring within the last 10 years: No If all of the above answers are "NO", then may proceed with Cephalosporin use.   . Aspirin Nausea Only and Other (See Comments)    Upset stomach  . Erythromycin Nausea And Vomiting  . Morphine And Related Other (See Comments)    Oversedated "one tablet last for 1 week"  . Naproxen Nausea Only and Other (See Comments)    Medications Prior to Admission  Medication Sig Dispense Refill  . albuterol (VENTOLIN HFA) 108 (90 Base) MCG/ACT inhaler  Inhale 2 puffs into the lungs every 4 (four) hours as needed. For wheezing or shortness of breath.    . budesonide-formoterol (SYMBICORT) 160-4.5 MCG/ACT inhaler Inhale 2 puffs into the lungs 2 (two) times daily.    Marland Kitchen buPROPion (WELLBUTRIN) 75 MG tablet Take 75 mg by mouth 2 (two) times daily.    . Cyanocobalamin (RA VITAMIN B12) 2000 MCG TBCR Take 2,000 mcg by mouth at bedtime.    . cyclobenzaprine (FLEXERIL) 10 MG tablet Take 10 mg by mouth 3 (three) times daily as needed. For pain.  2  . esomeprazole (NEXIUM 24HR) 20 MG capsule Take 40 mg by mouth at bedtime.    . gabapentin (NEURONTIN) 300 MG capsule Take 300 mg by mouth 3 (three) times daily.    Marland Kitchen HYDROcodone-acetaminophen (NORCO/VICODIN) 5-325 MG tablet Take 1 tablet by mouth every 8 (eight) hours as needed. For pain.  0  . lisinopril (PRINIVIL,ZESTRIL) 10 MG tablet Take 10 mg by mouth at bedtime.     . SF 5000 PLUS 1.1 % CREA dental cream Place 1 application onto teeth at bedtime.  3  . Vitamin D, Ergocalciferol, (DRISDOL) 50000 units CAPS capsule Take 50,000 Units by mouth every Tuesday.  3  . vitamin E (VITAMIN E) 400 UNIT capsule Take 800 Units by mouth at bedtime.    . OnabotulinumtoxinA (BOTOX IJ) Inject as directed every 3 (three) months.      No results found for this or any previous visit (from the  past 48 hour(s)). No results found.  Pertinent items noted in HPI and remainder of comprehensive ROS otherwise negative.  Blood pressure 113/62, pulse 88, temperature 98 F (36.7 C), temperature source Oral, resp. rate 18, SpO2 100 %.  Patient is awake and alert. She is oriented and appropriate. Her speech is fluent. Judgment and insight are intact. Cranial nerve function normal bilaterally. Motor examination of the extremities is normal. Sensory examination with mild decreased sensation to light touch in her left L5 dermatome. Deep and reveals normal active except for Achilles releases are absent. Gait is antalgic. Posture is mildly  flexed. No evidence of long track signs. Examination head ears eyes and thirds unremarkable chest and abdomen are benign. Extremities are free from injury deformity. 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 Plan L5-S1 bilateral decompressive laminotomies with posterior lumbar my fusion utilizing interbody cages, locally harvested autograft, and augmented with posterior lateral arthrodesis utilizing nonsegmental pedicle screw fixation and local autografting. Risks minutes been explained. Patient wishes to proceed.  Vincie Linn A 08/12/2016, 7:49 AM

## 2016-08-12 NOTE — Anesthesia Procedure Notes (Signed)
Procedure Name: Intubation Date/Time: 08/12/2016 8:10 AM Performed by: Lavell Luster Pre-anesthesia Checklist: Patient identified, Emergency Drugs available, Suction available, Patient being monitored and Timeout performed Patient Re-evaluated:Patient Re-evaluated prior to inductionOxygen Delivery Method: Circle system utilized Preoxygenation: Pre-oxygenation with 100% oxygen Intubation Type: IV induction Ventilation: Mask ventilation without difficulty Laryngoscope Size: Mac and 3 Grade View: Grade I Tube type: Oral Tube size: 7.5 mm Number of attempts: 1 Airway Equipment and Method: Stylet Placement Confirmation: ETT inserted through vocal cords under direct vision,  positive ETCO2 and breath sounds checked- equal and bilateral Secured at: 22 cm Tube secured with: Tape Dental Injury: Teeth and Oropharynx as per pre-operative assessment

## 2016-08-12 NOTE — Evaluation (Signed)
Physical Therapy Evaluation Patient Details Name: Phyllis Park MRN: [id number] DOB: 1971-01-12 Today's Date: 08/12/2016   History of Present Illness  Pt is a 46 y/o female s/p L5-S1 PLIF. PMH includes asthma, fibromyalgia, HTN, sleep apnea, and heart murmur.   Clinical Impression  Patient is s/p above surgery resulting in the deficits listed below (see PT Problem List). PTA, pt was independent with ambulation. Upon evaluation, pt limited by pain, weakness, and decreased balance. Pt requiring min guard to min A for functional mobility. Will need to practice stair management prior to return home. Pt reports she will have assist 24/7 at home. Patient will benefit from skilled PT to increase their independence and safety with mobility (while adhering to their precautions) to allow discharge to the venue listed below. Will continue to follow acutely.      Follow Up Recommendations No PT follow up;Supervision for mobility/OOB    Equipment Recommendations  None recommended by PT    Recommendations for Other Services       Precautions / Restrictions Precautions Precautions: Back Precaution Booklet Issued: Yes (comment) Precaution Comments: Reviewed precaution handout with pt.  Required Braces or Orthoses: Spinal Brace Spinal Brace: Lumbar corset;Applied in sitting position Restrictions Weight Bearing Restrictions: No      Mobility  Bed Mobility Overal bed mobility: Needs Assistance Bed Mobility: Rolling;Sidelying to Sit;Sit to Sidelying Rolling: Min guard Sidelying to sit: Min assist     Sit to sidelying: Min guard General bed mobility comments: Min A for trunk elevation upon sitting. Min guard throughout remainder of bed mobility to ensure appropriate log roll technique.   Transfers Overall transfer level: Needs assistance Equipment used: 1 person hand held assist Transfers: Sit to/from Stand Sit to Stand: Min guard         General transfer comment: Min guard for  safety.   Ambulation/Gait Ambulation/Gait assistance: Min guard Ambulation Distance (Feet): 300 Feet Assistive device:  (IV pole ) Gait Pattern/deviations: Step-through pattern;Decreased stride length Gait velocity: Decreased Gait velocity interpretation: Below normal speed for age/gender General Gait Details: Slow, guarded gait secondary to pain. Used IV pole for steadying throughout. cues for upright posture. Education about performing generalized walking program at home.   Stairs            Wheelchair Mobility    Modified Rankin (Stroke Patients Only)       Balance Overall balance assessment: Needs assistance Sitting-balance support: No upper extremity supported;Feet supported Sitting balance-Leahy Scale: Good     Standing balance support: No upper extremity supported;Single extremity supported;During functional activity Standing balance-Leahy Scale: Fair Standing balance comment: Able to maintain static standing without UE support                              Pertinent Vitals/Pain Pain Assessment: 0-10 Pain Score: 8  Pain Location: back  Pain Descriptors / Indicators: Aching;Grimacing;Operative site guarding;Sore Pain Intervention(s): Limited activity within patient's tolerance;Monitored during session;Repositioned    Home Living Family/patient expects to be discharged to:: Private residence Living Arrangements: Spouse/significant other;Children Available Help at Discharge: Family;Available 24 hours/day Type of Home: House Home Access: Stairs to enter Entrance Stairs-Rails: None Entrance Stairs-Number of Steps: 3 Home Layout: Two level;Able to live on main level with bedroom/bathroom Home Equipment: None      Prior Function Level of Independence: Independent               Hand Dominance  Extremity/Trunk Assessment   Upper Extremity Assessment Upper Extremity Assessment: Defer to OT evaluation    Lower Extremity  Assessment Lower Extremity Assessment: LLE deficits/detail LLE Deficits / Details: Reports pain at baseline, however, reports that is much better since surgery     Cervical / Trunk Assessment Cervical / Trunk Assessment: Other exceptions Cervical / Trunk Exceptions: s/p back surgery   Communication   Communication: No difficulties  Cognition Arousal/Alertness: Awake/alert Behavior During Therapy: WFL for tasks assessed/performed Overall Cognitive Status: Within Functional Limits for tasks assessed                                        General Comments General comments (skin integrity, edema, etc.): Pt fiance present during session.     Exercises     Assessment/Plan    PT Assessment Patient needs continued PT services  PT Problem List Decreased strength;Decreased balance;Decreased mobility;Decreased knowledge of precautions;Pain       PT Treatment Interventions DME instruction;Gait training;Stair training;Functional mobility training;Therapeutic activities;Patient/family education    PT Goals (Current goals can be found in the Care Plan section)  Acute Rehab PT Goals Patient Stated Goal: to decrease pain  PT Goal Formulation: With patient Time For Goal Achievement: 08/19/16 Potential to Achieve Goals: Good    Frequency Min 5X/week   Barriers to discharge        Co-evaluation               AM-PAC PT "6 Clicks" Daily Activity  Outcome Measure Difficulty turning over in bed (including adjusting bedclothes, sheets and blankets)?: A Little Difficulty moving from lying on back to sitting on the side of the bed? : Total Difficulty sitting down on and standing up from a chair with arms (e.g., wheelchair, bedside commode, etc,.)?: Total Help needed moving to and from a bed to chair (including a wheelchair)?: A Little Help needed walking in hospital room?: A Little Help needed climbing 3-5 steps with a railing? : A Little 6 Click Score: 14    End  of Session Equipment Utilized During Treatment: Gait belt;Back brace Activity Tolerance: Patient tolerated treatment well Patient left: in bed;with call bell/phone within reach Nurse Communication: Mobility status PT Visit Diagnosis: Other abnormalities of gait and mobility (R26.89);Pain Pain - part of body:  (back )    Time: 1335-1400 PT Time Calculation (min) (ACUTE ONLY): 25 min   Charges:   PT Evaluation $PT Eval Low Complexity: 1 Procedure PT Treatments $Gait Training: 8-22 mins   PT G Codes:        Leighton Ruff, PT, DPT  Acute Rehabilitation Services  Pager: [phone]   Rudean Hitt 08/12/2016, 3:00 PM

## 2016-08-12 NOTE — Brief Op Note (Signed)
08/12/2016  10:32 AM  PATIENT:  Phil Dopp  46 y.o. female  PRE-OPERATIVE DIAGNOSIS:  DDD Lumbar  POST-OPERATIVE DIAGNOSIS:  DDD Lumbar  PROCEDURE:  Procedure(s): PLIF - L5-S1 (N/A)  SURGEON:  Surgeon(s) and Role:    * Earnie Larsson, MD - Primary    * Eustace Moore, MD - Assisting  PHYSICIAN ASSISTANT:   ASSISTANTS:    ANESTHESIA:   general  EBL:  Total I/O In: 2000 [I.V.:2000] Out: 640 [Urine:490; Blood:150]  BLOOD ADMINISTERED:none  DRAINS: none   LOCAL MEDICATIONS USED:  MARCAINE     SPECIMEN:  No Specimen  DISPOSITION OF SPECIMEN:  N/A  COUNTS:  YES  TOURNIQUET:  * No tourniquets in log *  DICTATION: .Dragon Dictation  PLAN OF CARE: Admit to inpatient   PATIENT DISPOSITION:  PACU - hemodynamically stable.   Delay start of Pharmacological VTE agent (>24hrs) due to surgical blood loss or risk of bleeding: yes

## 2016-08-12 NOTE — Anesthesia Preprocedure Evaluation (Signed)
Anesthesia Evaluation  Patient identified by MRN, date of birth, ID band Patient awake    Reviewed: Allergy & Precautions, H&P , NPO status , Patient's Chart, lab work & pertinent test results  History of Anesthesia Complications (+) PONV and history of anesthetic complications  Airway Mallampati: II   Neck ROM: full    Dental   Pulmonary sleep apnea ,    breath sounds clear to auscultation       Cardiovascular hypertension,  Rhythm:regular Rate:Normal     Neuro/Psych  Headaches,    GI/Hepatic GERD  ,  Endo/Other    Renal/GU      Musculoskeletal  (+) Fibromyalgia -  Abdominal   Peds  Hematology   Anesthesia Other Findings   Reproductive/Obstetrics                             Anesthesia Physical Anesthesia Plan  ASA: II  Anesthesia Plan: General   Post-op Pain Management:    Induction: Intravenous  PONV Risk Score and Plan: 4 or greater and Ondansetron, Dexamethasone, Propofol, Midazolam and Scopolamine patch - Pre-op  Airway Management Planned: Oral ETT  Additional Equipment:   Intra-op Plan:   Post-operative Plan: Extubation in OR  Informed Consent: I have reviewed the patients History and Physical, chart, labs and discussed the procedure including the risks, benefits and alternatives for the proposed anesthesia with the patient or authorized representative who has indicated his/her understanding and acceptance.     Plan Discussed with: CRNA, Anesthesiologist and Surgeon  Anesthesia Plan Comments:         Anesthesia Quick Evaluation

## 2016-08-12 NOTE — Transfer of Care (Signed)
Immediate Anesthesia Transfer of Care Note  Patient: Phyllis Park  Procedure(s) Performed: Procedure(s): PLIF - L5-S1 (N/A)  Patient Location: PACU  Anesthesia Type:General  Level of Consciousness: awake, alert  and oriented  Airway & Oxygen Therapy: Patient Spontanous Breathing and Patient connected to nasal cannula oxygen  Post-op Assessment: Report given to RN and Post -op Vital signs reviewed and stable  Post vital signs: Reviewed and stable  Last Vitals:  Vitals:   08/12/16 0747  BP: 113/62  Pulse: 88  Resp: 18  Temp: 36.7 C    Last Pain:  Vitals:   08/12/16 0747  TempSrc: Oral         Complications: No apparent anesthesia complications

## 2016-08-12 NOTE — Op Note (Signed)
Date of procedure: 08/12/2016  Date of dictation: Same  Service: Neurosurgery  Preoperative diagnosis: L5-S1 degenerative spondylolisthesis with foraminal stenosis and chronic back and radicular pain.  Postoperative diagnosis: Same  Procedure Name: Bilateral L5-S1 decompressive laminotomies with bilateral L5 and S1 decompressive foraminotomies, more than would be required for simple interbody fusion alone.   L5-S1 posterior lumbar interbody fusion utilizing interbody cages and locally harvested autograft  L5-S1 posterior lateral arthrodesis utilizing nonsegmental pedicle screw fixation and local autograft. Jones  Surgeon:Jaleiyah Alas A.Tarun Patchell, M.D.  Asst. Surgeon: Ronnald Ramp  Anesthesia: General  Indication: 46 year old female with chronic and progressive back and lower extremity pain failing conservative management her workup demonstrates evidence of marked disc degeneration with retrolisthesis and foraminal stenosis at L5-S1. Patient presents now for decompression and fusion in hopes of improving her symptoms.  Operative note: After induction anesthesia, patient position prone onto Wilson frame and a properly padded. Lumbar region prepped and draped sterilely. Incision made overlying L5-S1. Dissection performed bilaterally. Retractors placed. Fluoroscopy used. Levels confirmed. Decompressive laminotomies and foraminotomies performed using Leksell rongeurs and Kerrison rongeurs. Ligament flavum elevated and resected. Foraminotomies completed with complete inferior facetectomies and superior facetectomies of L5 and S1 respectively. Foraminotomies completed on course exiting L5 and S1 nerve roots bilaterally. Bilateral discectomies performed including all elements the central disc herniation. Disc spaces then protected and prepared for interbody fusion. With the distractor placed in the right side the disc space in the left was further scraped and cleaned of soft tissue. An 8 mm regular Med[employer] expandable  cage packed with locally harvested autograft was then impacted into place and expanded to its full extent. Distractor removed patient's right side. Disc space prepared on the right side. Soft tissue removed and interspace. Morselize autograft packed and interspace. A second cage packed with local autograft was then impacted into place and her expanded to its full extent. Pedicles of L5 and S1 were notified using surface landmarks and intraoperative fluoroscopy. Superficial bone around the pedicle was then removed using high-speed drill. Each pedicles and probed using a pedicle awl each pedicle awl track was probed and found to be solidly within bone. Each pedicle awl track was then tapped with a screw tap. Each screw tap hole was probed and found to be solidly within the bone. 5.75 mm radius brand screws from [employer] were placed bilaterally at L5 and S1. Final images revealed good position of the cages and the screws at the proper upper level with normal alignment spine. Wounds and irrigated one final time. Gelfoam was placed topically for hemostasis. Residual facets and transverse processes were decorticated. Morselize autograft packed posterior laterally as well as magna fused bone graft extender. Short segment titanium rods placed or the screw heads at L5 and S1. Locking caps placed or the screws. Locking caps and engaged with the construct under compression. Twist over the laminotomy defects. Vancomycin powder placed the deep point space. Wounds and close in layers of Vicryl sutures. Steri-Strips and sterile dressing were applied. No apparent complications. Patient tolerated the procedure well and returns to the recovery room postop.

## 2016-08-13 MED ORDER — CYCLOBENZAPRINE HCL 10 MG PO TABS: 10.0000 mg | ORAL_TABLET | Freq: Three times a day (TID) | ORAL | 3 refills | Status: DC | PRN

## 2016-08-13 MED ORDER — OXYCODONE HCL 5 MG PO TABS: 5.0000 mg | ORAL_TABLET | ORAL | 0 refills | Status: DC | PRN

## 2016-08-13 NOTE — Discharge Summary (Signed)
Physician Discharge Summary  Patient ID: Phyllis Park MRN: [id number] DOB/AGE: 45-12-1970 45 y.o.  Admit date: 08/12/2016 Discharge date: 08/13/2016  Admission Diagnoses:Spondylolisthesis L5-S1 with left lumbar radiculopathy  Discharge Diagnoses: Spondylolisthesis L5-S1 with left lumbar radiculopathy  Active Problems:   Degenerative spondylolisthesis   Discharged Condition: good  Hospital Course: Patient was admitted to undergo surgical decompression and stabilization at L5-S1 which she tolerated well. Her left leg pain is substantially improved.  Consults: None  Significant Diagnostic Studies: None  Treatments: surgery: Decompression and fusion L5-S1 with pedicle screw fixation L5-S1.  Discharge Exam: Blood pressure (!) 98/52, pulse 97, temperature 98.6 F (37 C), resp. rate 16, height 5\' 1"  (1.549 m), weight 62.1 kg (137 lb), SpO2 100 %. Incision is clean and dry. Station and gait are intact.  Disposition: 01-Home or Self Care  Discharge Instructions    Call MD for:  redness, tenderness, or signs of infection (pain, swelling, redness, odor or green/yellow discharge around incision site)    Complete by:  As directed    Call MD for:  severe uncontrolled pain    Complete by:  As directed    Call MD for:  temperature >100.4    Complete by:  As directed    Diet - low sodium heart healthy    Complete by:  As directed    Increase activity slowly    Complete by:  As directed      Allergies as of 08/13/2016      Reactions   Penicillins    Has patient had a PCN reaction causing immediate rash, facial/tongue/throat swelling, SOB or lightheadedness with hypotension:Yes Has patient had a PCN reaction causing severe rash involving mucus membranes or skin necrosis:Yes Has patient had a PCN reaction that required hospitalization: No Has patient had a PCN reaction occurring within the last 10 years: No If all of the above answers are "NO", then may proceed with Cephalosporin  use.   Aspirin Nausea Only, Other (See Comments)   Upset stomach   Erythromycin Nausea And Vomiting   Morphine And Related Other (See Comments)   Oversedated "one tablet last for 1 week"   Naproxen Nausea Only, Other (See Comments)      Medication List    TAKE these medications   BOTOX IJ Inject as directed every 3 (three) months.   budesonide-formoterol 160-4.5 MCG/ACT inhaler Commonly known as:  SYMBICORT Inhale 2 puffs into the lungs 2 (two) times daily.   buPROPion 75 MG tablet Commonly known as:  WELLBUTRIN Take 75 mg by mouth 2 (two) times daily.   cyclobenzaprine 10 MG tablet Commonly known as:  FLEXERIL Take 10 mg by mouth 3 (three) times daily as needed. For pain. What changed:  Another medication with the same name was added. Make sure you understand how and when to take each.   cyclobenzaprine 10 MG tablet Commonly known as:  FLEXERIL Take 1 tablet (10 mg total) by mouth 3 (three) times daily as needed for muscle spasms. What changed:  You were already taking a medication with the same name, and this prescription was added. Make sure you understand how and when to take each.   gabapentin 300 MG capsule Commonly known as:  NEURONTIN Take 300 mg by mouth 3 (three) times daily.   HYDROcodone-acetaminophen 5-325 MG tablet Commonly known as:  NORCO/VICODIN Take 1 tablet by mouth every 8 (eight) hours as needed. For pain.   lisinopril 10 MG tablet Commonly known as:  PRINIVIL,ZESTRIL Take 10 mg by  mouth at bedtime.   NEXIUM 24HR 20 MG capsule Generic drug:  esomeprazole Take 40 mg by mouth at bedtime.   oxyCODONE 5 MG immediate release tablet Commonly known as:  Oxy IR/ROXICODONE Take 1-2 tablets (5-10 mg total) by mouth every 3 (three) hours as needed for severe pain.   RA VITAMIN B12 2000 MCG Tbcr Generic drug:  Cyanocobalamin Take 2,000 mcg by mouth at bedtime.   SF 5000 PLUS 1.1 % Crea dental cream Generic drug:  sodium fluoride Place 1 application  onto teeth at bedtime.   VENTOLIN HFA 108 (90 Base) MCG/ACT inhaler Generic drug:  albuterol Inhale 2 puffs into the lungs every 4 (four) hours as needed. For wheezing or shortness of breath.   Vitamin D (Ergocalciferol) 50000 units Caps capsule Commonly known as:  DRISDOL Take 50,000 Units by mouth every Tuesday.   vitamin E 400 UNIT capsule Generic drug:  vitamin E Take 800 Units by mouth at bedtime.            Durable Medical Equipment        Start     Ordered   08/12/16 1220  DME Walker rolling  Once    Question:  Patient needs a walker to treat with the following condition  Answer:  Degenerative spondylolisthesis   08/12/16 1219   08/12/16 1220  DME 3 n 1  Once     08/12/16 1219       Signed: Kristeen Miss J 08/13/2016, 8:30 AM

## 2016-08-13 NOTE — Progress Notes (Signed)
Patient ID: Phyllis Park, female   DOB: 1971-01-26, 46 y.o.   MRN: [id number] Vital signs are stable Motor function appears good in lower extremities Patient is ambulated without difficulty Ready for discharge

## 2016-08-13 NOTE — Progress Notes (Signed)
Patient is discharged from room 3C06 at this time. Alert and in stable condition. IV site d/c'd and instructions read to patient and husband with understanding verbalized. Left unit via wheelchair with all belongings at side.

## 2016-08-13 NOTE — Anesthesia Postprocedure Evaluation (Signed)
Anesthesia Post Note  Patient: Phyllis Park  Procedure(s) Performed: Procedure(s) (LRB): PLIF - L5-S1 (N/A)     Patient location during evaluation: PACU Anesthesia Type: General Level of consciousness: awake and alert and patient cooperative Pain management: pain level controlled Vital Signs Assessment: post-procedure vital signs reviewed and stable Respiratory status: spontaneous breathing and respiratory function stable Cardiovascular status: stable Anesthetic complications: no    Last Vitals:  Vitals:   08/13/16 0319 08/13/16 0822  BP: 102/68 (!) 98/52  Pulse: 99 97  Resp: 16 16  Temp: 36.8 C 37 C    Last Pain:  Vitals:   08/13/16 0926  TempSrc:   PainSc: 8    Pain Goal: Patients Stated Pain Goal: 3 (08/13/16 0413)               [place]

## 2016-08-13 NOTE — Evaluation (Signed)
Occupational Therapy Evaluation and Discharge Patient Details Name: Phyllis Park MRN: [id number] DOB: February 06, 1971 Today's Date: 08/13/2016    History of Present Illness Pt is a 46 y/o female s/p L5-S1 PLIF. PMH includes asthma, fibromyalgia, HTN, sleep apnea, and heart murmur.    Clinical Impression   This 46 yo female admitted and underwent above presents to acute OT with all education completed, we will D/C from acute OT.    Follow Up Recommendations  No OT follow up;Supervision - Intermittent    Equipment Recommendations  3 in 1 bedside commode       Precautions / Restrictions Precautions Precautions: Back Precaution Booklet Issued: Yes (comment) Precaution Comments: Reviewed precaution handout with pt.  Required Braces or Orthoses: Spinal Brace Spinal Brace: Lumbar corset;Applied in sitting position Restrictions Weight Bearing Restrictions: No      Mobility Bed Mobility Overal bed mobility: Modified Independent Bed Mobility: Rolling;Sidelying to Sit Rolling: Modified independent (Device/Increase time) Sidelying to sit: Modified independent (Device/Increase time)          Transfers Overall transfer level: Needs assistance Equipment used: None Transfers: Sit to/from Stand Sit to Stand: Modified independent (Device/Increase time)              Balance Overall balance assessment: Needs assistance Sitting-balance support: No upper extremity supported;Feet supported Sitting balance-Leahy Scale: Good     Standing balance support: No upper extremity supported;During functional activity Standing balance-Leahy Scale: Good Standing balance comment: able to stand at sink and brush teeth                           ADL either performed or assessed with clinical judgement   ADL                                         General ADL Comments: Educated pt on use of wet wipes for back peri-care, use of two cups for brushing teeth to  avoid bending, to have someone help her with socks until she is able to bring leg up over opposite knee to do them, stepping into tub with use of 3n1 as a shower seat.     Vision Patient Visual Report: No change from baseline              Pertinent Vitals/Pain Pain Assessment: 0-10 Pain Score: 8  Pain Location: back  Pain Descriptors / Indicators: Aching;Sore Pain Intervention(s): Limited activity within patient's tolerance;Monitored during session     Hand Dominance Right   Extremity/Trunk Assessment Upper Extremity Assessment Upper Extremity Assessment: Overall WFL for tasks assessed           Communication Communication Communication: No difficulties   Cognition Arousal/Alertness: Awake/alert Behavior During Therapy: WFL for tasks assessed/performed Overall Cognitive Status: Within Functional Limits for tasks assessed                                                Home Living Family/patient expects to be discharged to:: Private residence Living Arrangements: Spouse/significant other;Children Available Help at Discharge: Family;Available 24 hours/day Type of Home: House Home Access: Stairs to enter [employer] of Steps: 3 Entrance Stairs-Rails: None Home Layout: Two level;Able to live on main level with bedroom/bathroom  Bathroom Shower/Tub: Corporate investment banker: Handicapped height Bathroom Accessibility: No   Home Equipment: None          Prior Functioning/Environment Level of Independence: Independent                 OT Problem List: Decreased range of motion;Pain         OT Goals(Current goals can be found in the care plan section) Acute Rehab OT Goals Patient Stated Goal: home today  OT Frequency:                AM-PAC PT "6 Clicks" Daily Activity     Outcome Measure Help from another person eating meals?: None Help from another person taking care of personal grooming?:  None Help from another person toileting, which includes using toliet, bedpan, or urinal?: None Help from another person bathing (including washing, rinsing, drying)?: A Little Help from another person to put on and taking off regular upper body clothing?: None Help from another person to put on and taking off regular lower body clothing?: A Little 6 Click Score: 22   End of Session Equipment Utilized During Treatment: Back brace Nurse Communication:  (Pt needs 3n1)  Activity Tolerance: Patient tolerated treatment well Patient left:  (starting to ambulate with PT)  OT Visit Diagnosis: Pain Pain - part of body:  (back)                Time: 7026-3785 OT Time Calculation (min): 18 min Charges:  OT General Charges $OT Visit: 1 Procedure OT Evaluation $OT Eval Moderate Complexity: 1 Procedure [place] [address], OTR/L [phone] 08/13/2016

## 2016-08-13 NOTE — Progress Notes (Signed)
Physical Therapy Treatment Patient Details Name: Phyllis Park MRN: [id number] DOB: 03/30/1970 Today's Date: 08/13/2016    History of Present Illness Pt is a 46 y/o female s/p L5-S1 PLIF. PMH includes asthma, fibromyalgia, HTN, sleep apnea, and heart murmur.     PT Comments    Pt demonstrates good tolerance for gait and stair negotiation this session. Pt is able to negotiate 6 steps with right hand rail with minimal c/o pain. Pt continues to make steady progress toward goals with overall improved upright posture with gait.     Follow Up Recommendations  No PT follow up     Equipment Recommendations  None recommended by PT    Recommendations for Other Services       Precautions / Restrictions Precautions Precautions: Back Precaution Booklet Issued: Yes (comment) Precaution Comments: Reviewed precaution handout with pt.  Required Braces or Orthoses: Spinal Brace Spinal Brace: Lumbar corset;Applied in sitting position Restrictions Weight Bearing Restrictions: No    Mobility  Bed Mobility Overal bed mobility: Modified Independent Bed Mobility: Rolling;Sidelying to Sit Rolling: Modified independent (Device/Increase time) Sidelying to sit: Modified independent (Device/Increase time)       General bed mobility comments: Met with OT  Transfers Overall transfer level: Needs assistance Equipment used: None Transfers: Sit to/from Stand Sit to Stand: Modified independent (Device/Increase time)         General transfer comment: Met in hallway finishing up with OT  Ambulation/Gait Ambulation/Gait assistance: Supervision Ambulation Distance (Feet): 625 Feet Assistive device: None Gait Pattern/deviations: Step-through pattern;Decreased stride length;Antalgic Gait velocity: Decreased Gait velocity interpretation: Below normal speed for age/gender General Gait Details: Mild antalgic gait, good upright posture and sequencing. Just slower cadence   Stairs Stairs:  Yes   Stair Management: One rail Right;Step to pattern;Forwards      Wheelchair Mobility    Modified Rankin (Stroke Patients Only)       Balance Overall balance assessment: Needs assistance Sitting-balance support: No upper extremity supported;Feet supported Sitting balance-Leahy Scale: Good     Standing balance support: No upper extremity supported;During functional activity Standing balance-Leahy Scale: Good Standing balance comment: able to perform gait and stair negotiation with no AD                            Cognition Arousal/Alertness: Awake/alert Behavior During Therapy: WFL for tasks assessed/performed Overall Cognitive Status: Within Functional Limits for tasks assessed                                        Exercises      General Comments        Pertinent Vitals/Pain Pain Assessment: 0-10 Pain Score: 8  Pain Location: back  Pain Descriptors / Indicators: Aching;Sore Pain Intervention(s): Monitored during session;Premedicated before session;Repositioned    Home Living Family/patient expects to be discharged to:: Private residence Living Arrangements: Spouse/significant other;Children Available Help at Discharge: Family;Available 24 hours/day Type of Home: House Home Access: Stairs to enter Entrance Stairs-Rails: None Home Layout: Two level;Able to live on main level with bedroom/bathroom Home Equipment: None      Prior Function Level of Independence: Independent          PT Goals (current goals can now be found in the care plan section) Acute Rehab PT Goals Patient Stated Goal: home today Progress towards PT goals: Progressing toward goals  Frequency    Min 5X/week      PT Plan Current plan remains appropriate    Co-evaluation              AM-PAC PT "6 Clicks" Daily Activity  Outcome Measure  Difficulty turning over in bed (including adjusting bedclothes, sheets and blankets)?:  None Difficulty moving from lying on back to sitting on the side of the bed? : None Difficulty sitting down on and standing up from a chair with arms (e.g., wheelchair, bedside commode, etc,.)?: None Help needed moving to and from a bed to chair (including a wheelchair)?: None Help needed walking in hospital room?: None Help needed climbing 3-5 steps with a railing? : A Little 6 Click Score: 23    End of Session Equipment Utilized During Treatment: Gait belt;Back brace Activity Tolerance: Patient tolerated treatment well Patient left: with call bell/phone within reach;with family/visitor present Nurse Communication: Mobility status PT Visit Diagnosis: Other abnormalities of gait and mobility (R26.89);Pain Pain - Right/Left:  (lower) Pain - part of body:  (back)     Time: 0916-0924 PT Time Calculation (min) (ACUTE ONLY): 8 min  Charges:  $Gait Training: 8-22 mins                    G Codes:       Sabra M. Kress PT, DPT  [phone]    Sabra Marie Kress 08/13/2016, 9:33 AM   

## 2016-08-16 MED FILL — Sodium Chloride IV Soln 0.9%: INTRAVENOUS | Qty: 1000 | Status: AC

## 2016-08-16 MED FILL — Heparin Sodium (Porcine) Inj 1000 Unit/ML: INTRAMUSCULAR | Qty: 30 | Status: AC

## 2016-12-23 ENCOUNTER — Ambulatory Visit: Payer: Self-pay | Admitting: Obstetrics and Gynecology

## 2017-01-26 ENCOUNTER — Other Ambulatory Visit: Payer: Self-pay | Admitting: Obstetrics & Gynecology

## 2017-01-26 DIAGNOSIS — Z1239 Encounter for other screening for malignant neoplasm of breast: Secondary | ICD-10-CM

## 2017-03-03 ENCOUNTER — Other Ambulatory Visit: Payer: Self-pay | Admitting: Obstetrics & Gynecology

## 2017-03-03 ENCOUNTER — Ambulatory Visit
Admission: RE | Admit: 2017-03-03 | Discharge: 2017-03-03 | Disposition: A | Discharge status: Home / Self Care | Payer: Managed Care, Other (non HMO) | Source: Ambulatory Visit | Attending: Obstetrics & Gynecology | Admitting: Obstetrics & Gynecology

## 2017-03-03 DIAGNOSIS — Z1239 Encounter for other screening for malignant neoplasm of breast: Secondary | ICD-10-CM

## 2017-03-03 DIAGNOSIS — Z1231 Encounter for screening mammogram for malignant neoplasm of breast: Secondary | ICD-10-CM | POA: Insufficient documentation

## 2017-03-14 ENCOUNTER — Inpatient Hospital Stay
Admission: RE | Admit: 2017-03-14 | Discharge: 2017-03-14 | Disposition: A | Discharge status: Home / Self Care | Payer: Self-pay | Source: Ambulatory Visit | Attending: *Deleted | Admitting: *Deleted

## 2017-03-14 ENCOUNTER — Other Ambulatory Visit: Payer: Self-pay | Admitting: *Deleted

## 2017-03-14 DIAGNOSIS — Z9289 Personal history of other medical treatment: Secondary | ICD-10-CM

## 2017-04-28 ENCOUNTER — Other Ambulatory Visit: Payer: Self-pay

## 2017-04-28 ENCOUNTER — Encounter: Payer: Self-pay | Admitting: *Deleted

## 2017-04-28 ENCOUNTER — Ambulatory Visit
Admission: EM | Admit: 2017-04-28 | Discharge: 2017-04-28 | Disposition: A | Discharge status: Home / Self Care | Payer: Managed Care, Other (non HMO) | Attending: Family Medicine | Admitting: Family Medicine

## 2017-04-28 DIAGNOSIS — H6992 Unspecified Eustachian tube disorder, left ear: Secondary | ICD-10-CM | POA: Diagnosis not present

## 2017-04-28 DIAGNOSIS — H6502 Acute serous otitis media, left ear: Secondary | ICD-10-CM

## 2017-04-28 LAB — RAPID STREP SCREEN (MED CTR MEBANE ONLY): STREPTOCOCCUS, GROUP A SCREEN (DIRECT): NEGATIVE

## 2017-04-28 MED ORDER — CEPHALEXIN 500 MG PO CAPS: 500.0000 mg | ORAL_CAPSULE | Freq: Three times a day (TID) | ORAL | 0 refills | Status: DC

## 2017-04-28 NOTE — ED Triage Notes (Signed)
Patient started having symptom of sore throat 1 week ago. Left ear pain started today.

## 2017-04-28 NOTE — ED Provider Notes (Signed)
[facility]    CSN: [id number] Arrival date & time: 04/28/17  1505     History   Chief Complaint Chief Complaint  Patient presents with  . Sore Throat  . Otalgia    HPI Phyllis Park is a 47 y.o. female.   47 yo female with a c/o sore throat and nasal congestion for 1 week and 2 days of left ear pain. Denies any fevers or chills.   The history is provided by the patient.  Sore Throat  This is a new problem.  Otalgia    Past Medical History:  Diagnosis Date  . Asthma   . Fibromyalgia   . GERD (gastroesophageal reflux disease)   . Headache    migraines  . Heart murmur   . Herniated lumbar intervertebral disc   . Hypertension   . PONV (postoperative nausea and vomiting)   . Sleep apnea    not diagnosed via a study    Patient Active Problem List   Diagnosis Date Noted  . Degenerative spondylolisthesis 08/12/2016    Past Surgical History:  Procedure Laterality Date  . ABDOMINAL HYSTERECTOMY    . BILATERAL CARPAL TUNNEL RELEASE    . bladder tact    . TUBAL LIGATION      OB History   None      Home Medications    Prior to Admission medications   Medication Sig Start Date End Date Taking? Authorizing Provider  albuterol (VENTOLIN HFA) 108 (90 Base) MCG/ACT inhaler Inhale 2 puffs into the lungs every 4 (four) hours as needed. For wheezing or shortness of breath. 06/30/15  Yes [provider]  budesonide-formoterol (SYMBICORT) 160-4.5 MCG/ACT inhaler Inhale 2 puffs into the lungs 2 (two) times daily.   Yes [provider]  buPROPion (WELLBUTRIN) 75 MG tablet Take 75 mg by mouth 2 (two) times daily.   Yes [provider]  Cyanocobalamin (RA VITAMIN B12) 2000 MCG TBCR Take 2,000 mcg by mouth at bedtime.   Yes [provider]  cyclobenzaprine (FLEXERIL) 10 MG tablet Take 10 mg by mouth 3 (three) times daily as needed. For pain. 07/26/16  Yes [provider]  esomeprazole (NEXIUM 24HR) 20 MG  capsule Take 40 mg by mouth at bedtime.   Yes [provider]  gabapentin (NEURONTIN) 300 MG capsule Take 300 mg by mouth 3 (three) times daily.   Yes [provider]  HYDROcodone-acetaminophen (NORCO/VICODIN) 5-325 MG tablet Take 1 tablet by mouth every 8 (eight) hours as needed. For pain. 07/20/16  Yes [provider]  lisinopril (PRINIVIL,ZESTRIL) 10 MG tablet Take 10 mg by mouth at bedtime.    Yes [provider]  OnabotulinumtoxinA (BOTOX IJ) Inject as directed every 3 (three) months. 07/08/16  Yes [provider]  SF 5000 PLUS 1.1 % CREA dental cream Place 1 application onto teeth at bedtime. 05/27/16  Yes [provider]  Vitamin D, Ergocalciferol, (DRISDOL) 50000 units CAPS capsule Take 50,000 Units by mouth every Tuesday. 07/17/16  Yes [provider]  vitamin E (VITAMIN E) 400 UNIT capsule Take 800 Units by mouth at bedtime.   Yes [provider]  AIMOVIG 140 DOSE 70 MG/ML SOAJ INJECT 2 SYRINGES Q 28 DAYS 04/09/17   [provider]  cephALEXin (KEFLEX) 500 MG capsule Take 1 capsule (500 mg total) by mouth 3 (three) times daily. 04/28/17   Norval Gable, MD  diclofenac sodium (VOLTAREN) 1 % GEL APP 2 GRAMS EXT AA QID 04/10/17  [provider]  fluticasone (FLONASE) 50 MCG/ACT nasal spray Place into the nose.    [provider]  oxyCODONE (OXY IR/ROXICODONE) 5 MG immediate release tablet Take 1-2 tablets (5-10 mg total) by mouth every 3 (three) hours as needed for severe pain. 08/13/16   Kristeen Miss, MD    Family History Family History  Problem Relation Age of Onset  . Breast cancer Maternal Aunt     Social History Social History   Tobacco Use  . Smoking status: Never Smoker  . Smokeless tobacco: Never Used  Substance Use Topics  . Alcohol use: Yes    Comment: occassional  . Drug use: No     Allergies   Penicillins; Aspirin; Erythromycin; Morphine and related; and Naproxen   Review  of Systems Review of Systems  HENT: Positive for ear pain.      Physical Exam Triage Vital Signs ED Triage Vitals  Enc Vitals Group     BP 04/28/17 1531 (!) 149/87     Pulse Rate 04/28/17 1531 81     Resp 04/28/17 1531 16     Temp 04/28/17 1531 98.3 F (36.8 C)     Temp Source 04/28/17 1531 Oral     SpO2 04/28/17 1531 98 %     Weight 04/28/17 1532 140 lb (63.5 kg)     Height 04/28/17 1532 5\' 1"  (1.549 m)     Head Circumference --      Peak Flow --      Pain Score 04/28/17 1532 7     Pain Loc --      Pain Edu? --      Excl. in [place]? --    No data found.  Updated Vital Signs BP (!) 149/87 (BP Location: Left Arm)   Pulse 81   Temp 98.3 F (36.8 C) (Oral)   Resp 16   Ht 5\' 1"  (1.549 m)   Wt 140 lb (63.5 kg)   SpO2 98%   BMI 26.45 kg/m   Visual Acuity Right Eye Distance:   Left Eye Distance:   Bilateral Distance:    Right Eye Near:   Left Eye Near:    Bilateral Near:     Physical Exam  Constitutional: She appears well-developed and well-nourished. No distress.  HENT:  Head: Normocephalic and atraumatic.  Right Ear: Tympanic membrane, external ear and ear canal normal.  Left Ear: External ear and ear canal normal. Tympanic membrane is erythematous and bulging. A middle ear effusion is present.  Nose: Mucosal edema and rhinorrhea present. No nose lacerations, sinus tenderness, nasal deformity, septal deviation or nasal septal hematoma. No epistaxis.  No foreign bodies. Right sinus exhibits maxillary sinus tenderness and frontal sinus tenderness. Left sinus exhibits maxillary sinus tenderness and frontal sinus tenderness.  Mouth/Throat: Uvula is midline, oropharynx is clear and moist and mucous membranes are normal. No oropharyngeal exudate.  Eyes: Conjunctivae are normal. Right eye exhibits no discharge. Left eye exhibits no discharge. No scleral icterus.  Neck: Normal range of motion. Neck supple. No thyromegaly present.  Cardiovascular: Normal rate, regular rhythm  and normal heart sounds.  Pulmonary/Chest: Effort normal and breath sounds normal. No respiratory distress. She has no wheezes. She has no rales.  Lymphadenopathy:    She has no cervical adenopathy.  Skin: She is not diaphoretic.  Nursing note and vitals reviewed.    UC Treatments / Results  Labs (all labs ordered are listed, but only abnormal results are displayed) Labs Reviewed  RAPID STREP SCREEN (  NOT AT [facility])  CULTURE, GROUP A STREP [facility])    EKG None Radiology No results found.  Procedures Procedures (including critical care time)  Medications Ordered in UC Medications - No data to display   Initial Impression / Assessment and Plan / UC Course  I have reviewed the triage vital signs and the nursing notes.  Pertinent labs & imaging results that were available during my care of the patient were reviewed by me and considered in my medical decision making (see chart for details).       Final Clinical Impressions(s) / UC Diagnoses   Final diagnoses:  Disorder of left eustachian tube  Acute serous otitis media of left ear, recurrence not specified    ED Discharge Orders        Ordered    cephALEXin (KEFLEX) 500 MG capsule  3 times daily     04/28/17 1636     1. diagnosis reviewed with patient 2. rx as per orders above; reviewed possible side effects, interactions, risks and benefits  3. Recommend supportive treatment with otc analgesics prn 4. Follow-up prn if symptoms worsen or don't improve  Controlled Substance Prescriptions [place] Controlled Substance Registry consulted? Not Applicable   Norval Gable, MD 04/28/17 [phone]

## 2017-05-01 LAB — CULTURE, GROUP A STREP (THRC)

## 2018-04-10 ENCOUNTER — Ambulatory Visit
Admission: EM | Admit: 2018-04-10 | Discharge: 2018-04-10 | Disposition: A | Discharge status: Home / Self Care | Payer: Managed Care, Other (non HMO) | Attending: Family Medicine | Admitting: Family Medicine

## 2018-04-10 ENCOUNTER — Encounter: Payer: Self-pay | Admitting: Emergency Medicine

## 2018-04-10 ENCOUNTER — Other Ambulatory Visit: Payer: Self-pay

## 2018-04-10 DIAGNOSIS — J4521 Mild intermittent asthma with (acute) exacerbation: Secondary | ICD-10-CM

## 2018-04-10 MED ORDER — PREDNISONE 20 MG PO TABS: ORAL_TABLET | ORAL | 0 refills | Status: DC

## 2018-04-10 NOTE — ED Provider Notes (Signed)
[facility]    CSN: [id number] Arrival date & time: 04/10/18  1833     History   Chief Complaint Chief Complaint  Patient presents with  . Shortness of Breath  . Asthma    HPI Phyllis Park is a 48 y.o. female.   The history is provided by the patient.  Shortness of Breath  Severity:  Mild Onset quality:  Sudden Duration:  1 day Timing:  Intermittent Progression:  Waxing and waning Chronicity:  New Context: URI   Relieved by:  Inhaler Associated symptoms: wheezing   Associated symptoms: no abdominal pain, no chest pain, no claudication, no cough, no diaphoresis, no ear pain, no fever, no headaches, no hemoptysis, no neck pain, no PND, no rash, no sore throat, no sputum production, no syncope, no swollen glands and no vomiting   Asthma  Associated symptoms include shortness of breath. Pertinent negatives include no chest pain, no abdominal pain and no headaches.    Past Medical History:  Diagnosis Date  . Asthma   . Fibromyalgia   . GERD (gastroesophageal reflux disease)   . Headache    migraines  . Heart murmur   . Herniated lumbar intervertebral disc   . Hypertension   . PONV (postoperative nausea and vomiting)   . Sleep apnea    not diagnosed via a study    Patient Active Problem List   Diagnosis Date Noted  . Degenerative spondylolisthesis 08/12/2016    Past Surgical History:  Procedure Laterality Date  . ABDOMINAL HYSTERECTOMY    . BACK SURGERY    . BILATERAL CARPAL TUNNEL RELEASE    . bladder tact    . TUBAL LIGATION      OB History   No obstetric history on file.      Home Medications    Prior to Admission medications   Medication Sig Start Date End Date Taking? Authorizing Provider  AIMOVIG 140 DOSE 70 MG/ML SOAJ INJECT 2 SYRINGES Q 28 DAYS 04/09/17  Yes [provider]  albuterol (VENTOLIN HFA) 108 (90 Base) MCG/ACT inhaler Inhale 2 puffs into the lungs every 4 (four) hours as needed. For wheezing or  shortness of breath. 06/30/15  Yes [provider]  budesonide-formoterol (SYMBICORT) 160-4.5 MCG/ACT inhaler Inhale 2 puffs into the lungs 2 (two) times daily.   Yes [provider]  buPROPion (WELLBUTRIN) 75 MG tablet Take 75 mg by mouth 2 (two) times daily.   Yes [provider]  Cyanocobalamin (RA VITAMIN B12) 2000 MCG TBCR Take 2,000 mcg by mouth at bedtime.   Yes [provider]  cyclobenzaprine (FLEXERIL) 10 MG tablet Take 10 mg by mouth 3 (three) times daily as needed. For pain. 07/26/16  Yes [provider]  diclofenac sodium (VOLTAREN) 1 % GEL APP 2 GRAMS EXT AA QID 04/10/17  Yes [provider]  esomeprazole (NEXIUM 24HR) 20 MG capsule Take 40 mg by mouth at bedtime.   Yes [provider]  fluticasone (FLONASE) 50 MCG/ACT nasal spray Place into the nose.   Yes [provider]  gabapentin (NEURONTIN) 300 MG capsule Take 300 mg by mouth 3 (three) times daily.   Yes [provider]  HYDROcodone-acetaminophen (NORCO/VICODIN) 5-325 MG tablet Take 1 tablet by mouth every 8 (eight) hours as needed. For pain. 07/20/16  Yes [provider]  lisinopril (PRINIVIL,ZESTRIL) 10 MG tablet Take 10 mg by mouth at bedtime.    Yes [provider]  OnabotulinumtoxinA (BOTOX IJ) Inject as directed every  3 (three) months. 07/08/16  Yes [provider]  Vitamin D, Ergocalciferol, (DRISDOL) 50000 units CAPS capsule Take 50,000 Units by mouth every Tuesday. 07/17/16  Yes [provider]  vitamin E (VITAMIN E) 400 UNIT capsule Take 800 Units by mouth at bedtime.   Yes [provider]  cephALEXin (KEFLEX) 500 MG capsule Take 1 capsule (500 mg total) by mouth 3 (three) times daily. 04/28/17   Norval Gable, MD  oxyCODONE (OXY IR/ROXICODONE) 5 MG immediate release tablet Take 1-2 tablets (5-10 mg total) by mouth every 3 (three) hours as needed for severe pain. 08/13/16   Kristeen Miss, MD  predniSONE  (DELTASONE) 20 MG tablet 3 tabs po once day 1, then 2 tabs po qd x 2 days, then 1 tab po qd x 2 days, then half a tab po qd x 2 days 04/10/18   Norval Gable, MD  SF 5000 PLUS 1.1 % CREA dental cream Place 1 application onto teeth at bedtime. 05/27/16   [provider]    Family History Family History  Problem Relation Age of Onset  . Breast cancer Maternal Aunt     Social History Social History   Tobacco Use  . Smoking status: Never Smoker  . Smokeless tobacco: Never Used  Substance Use Topics  . Alcohol use: Yes    Comment: occassional  . Drug use: No     Allergies   Penicillins; Aspirin; Erythromycin; Morphine and related; and Naproxen   Review of Systems Review of Systems  Constitutional: Negative for diaphoresis and fever.  HENT: Negative for ear pain and sore throat.   Respiratory: Positive for shortness of breath and wheezing. Negative for cough, hemoptysis and sputum production.   Cardiovascular: Negative for chest pain, claudication, syncope and PND.  Gastrointestinal: Negative for abdominal pain and vomiting.  Musculoskeletal: Negative for neck pain.  Skin: Negative for rash.  Neurological: Negative for headaches.     Physical Exam Triage Vital Signs ED Triage Vitals  Enc Vitals Group     BP 04/10/18 1846 (!) 156/80     Pulse Rate 04/10/18 1846 82     Resp 04/10/18 1846 16     Temp 04/10/18 1846 98.4 F (36.9 C)     Temp Source 04/10/18 1846 Oral     SpO2 04/10/18 1846 99 %     Weight 04/10/18 1843 140 lb (63.5 kg)     Height 04/10/18 1843 5\' 1"  (1.549 m)     Head Circumference --      Peak Flow --      Pain Score 04/10/18 1843 0     Pain Loc --      Pain Edu? --      Excl. in [place]? --    No data found.  Updated Vital Signs BP (!) 156/80 (BP Location: Left Arm)   Pulse 82   Temp 98.4 F (36.9 C) (Oral)   Resp 16   Ht 5\' 1"  (1.549 m)   Wt 63.5 kg   SpO2 99%   BMI 26.45 kg/m   Visual Acuity Right Eye Distance:   Left Eye  Distance:   Bilateral Distance:    Right Eye Near:   Left Eye Near:    Bilateral Near:     Physical Exam Vitals signs and nursing note reviewed.  Constitutional:      General: She is not in acute distress.    Appearance: She is well-developed. She is not toxic-appearing or diaphoretic.  HENT:  Mouth/Throat:     Pharynx: Uvula midline. No oropharyngeal exudate.  Neck:     Musculoskeletal: Normal range of motion and neck supple.     Thyroid: No thyromegaly.  Cardiovascular:     Rate and Rhythm: Normal rate and regular rhythm.     Heart sounds: Normal heart sounds.  Pulmonary:     Effort: Pulmonary effort is normal. No respiratory distress.     Breath sounds: Normal breath sounds. No stridor. No wheezing, rhonchi or rales.  Lymphadenopathy:     Cervical: No cervical adenopathy.  Neurological:     Mental Status: She is alert.      UC Treatments / Results  Labs (all labs ordered are listed, but only abnormal results are displayed) Labs Reviewed - No data to display  EKG None  Radiology No results found.  Procedures Procedures (including critical care time)  Medications Ordered in UC Medications - No data to display  Initial Impression / Assessment and Plan / UC Course  I have reviewed the triage vital signs and the nursing notes.  Pertinent labs & imaging results that were available during my care of the patient were reviewed by me and considered in my medical decision making (see chart for details).      Final Clinical Impressions(s) / UC Diagnoses   Final diagnoses:  Mild intermittent asthma with exacerbation    ED Prescriptions    Medication Sig Dispense Auth. Provider   predniSONE (DELTASONE) 20 MG tablet 3 tabs po once day 1, then 2 tabs po qd x 2 days, then 1 tab po qd x 2 days, then half a tab po qd x 2 days 10 tablet Clayton Bosserman, Linward Foster, MD     1. diagnosis reviewed with patient 2. rx as per orders above; reviewed possible side effects,  interactions, risks and benefits  3. Recommend continue current inhalers 4. Follow-up prn if symptoms worsen or don't improve   Controlled Substance Prescriptions [place] Controlled Substance Registry consulted? Not Applicable   Norval Gable, MD 04/10/18 2021

## 2018-04-10 NOTE — ED Triage Notes (Signed)
Patient c/o SOB that started last night.  Patient states that she has asthma.

## 2018-05-02 IMAGING — RF DG LUMBAR SPINE 2-3V
1 series · 2 of 2 positions shown · non-contrast
Comparison: Lumbar MRI, 05/18/2016

CLINICAL DATA: Portal imaging acquired during L5-S1 posterior
lumbar spine fusion.

EXAM:
LUMBAR SPINE - 2-3 VIEW; DG C-ARM 61-120 MIN

[Series 1: run · 2 of 2 slices shown]
[im 1/2]
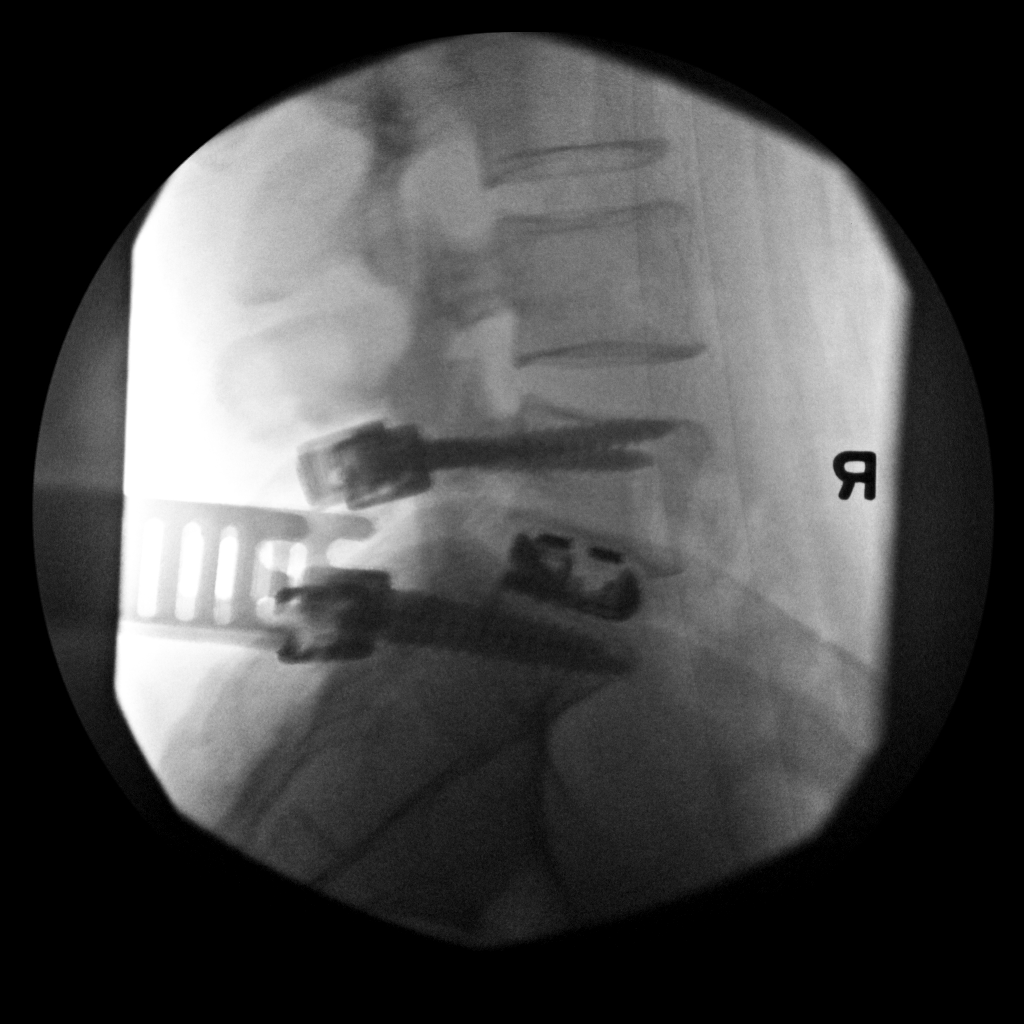
[im 2/2]
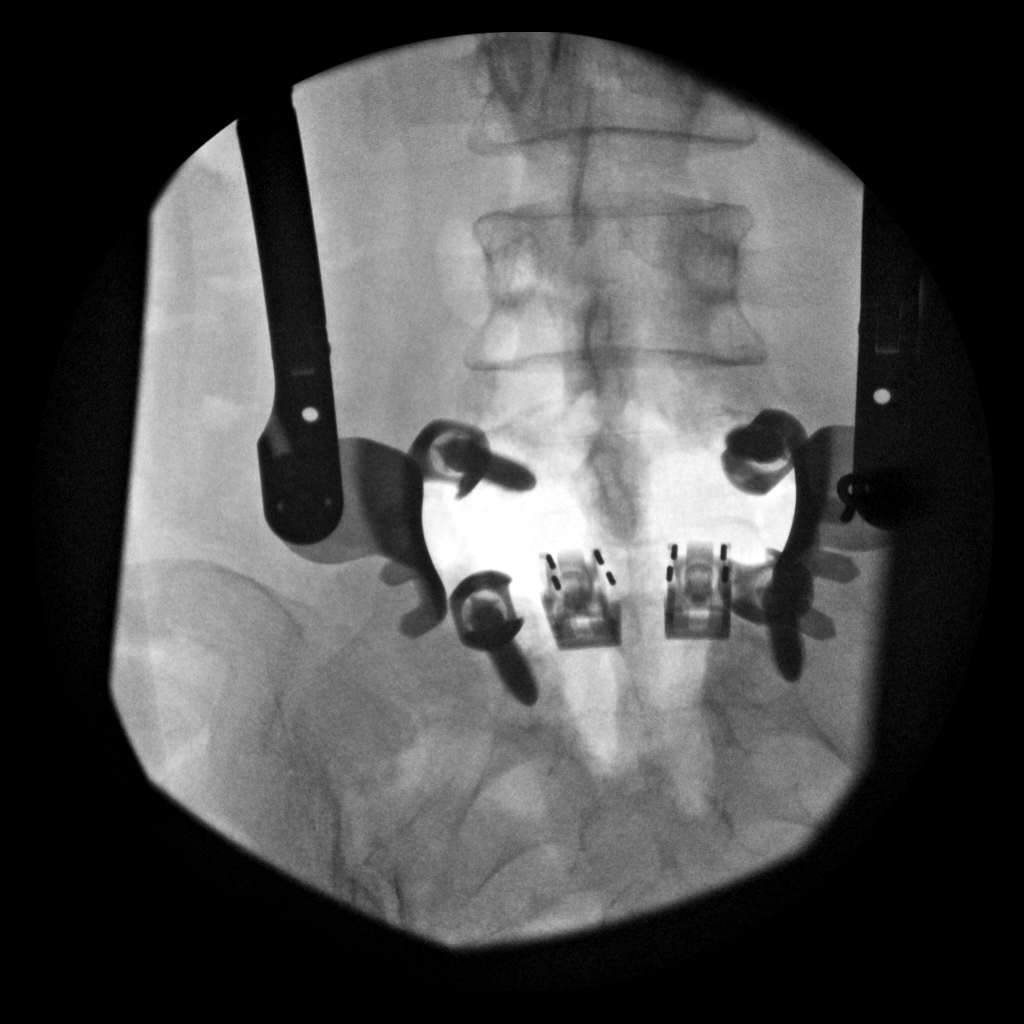

[2 of 2 positions shown; findings below may reference images not displayed]

FINDINGS: Images show placement of pedicle screws at L5 and S1, and
intervertebral cages, well-centered, at the L5-S1 disc interspace.
The pedicle screws appear well seated and well-positioned.
IMPRESSION: Well-positioned orthopedic hardware for L5-S1 posterior lumbar spine
fusion.

## 2018-07-18 ENCOUNTER — Other Ambulatory Visit (HOSPITAL_COMMUNITY): Payer: Self-pay | Admitting: Neurosurgery

## 2018-07-18 ENCOUNTER — Other Ambulatory Visit: Payer: Self-pay | Admitting: Neurosurgery

## 2018-07-18 DIAGNOSIS — M5136 Other intervertebral disc degeneration, lumbar region: Secondary | ICD-10-CM

## 2018-07-26 ENCOUNTER — Encounter (INDEPENDENT_AMBULATORY_CARE_PROVIDER_SITE_OTHER): Payer: Self-pay

## 2018-07-26 ENCOUNTER — Ambulatory Visit
Admission: RE | Admit: 2018-07-26 | Discharge: 2018-07-26 | Disposition: A | Discharge status: Home / Self Care | Payer: Commercial Managed Care - PPO | Source: Ambulatory Visit | Attending: Neurosurgery | Admitting: Neurosurgery

## 2018-07-26 ENCOUNTER — Other Ambulatory Visit: Payer: Self-pay

## 2018-07-26 DIAGNOSIS — M5136 Other intervertebral disc degeneration, lumbar region: Secondary | ICD-10-CM | POA: Diagnosis not present

## 2018-07-26 DIAGNOSIS — M51369 Other intervertebral disc degeneration, lumbar region without mention of lumbar back pain or lower extremity pain: Secondary | ICD-10-CM

## 2019-02-20 ENCOUNTER — Other Ambulatory Visit: Payer: Self-pay | Admitting: Family Medicine

## 2019-02-20 DIAGNOSIS — Z1231 Encounter for screening mammogram for malignant neoplasm of breast: Secondary | ICD-10-CM

## 2019-08-24 ENCOUNTER — Ambulatory Visit
Admission: EM | Admit: 2019-08-24 | Discharge: 2019-08-24 | Disposition: A | Discharge status: Home / Self Care | Payer: Commercial Managed Care - PPO | Attending: Family Medicine | Admitting: Family Medicine

## 2019-08-24 ENCOUNTER — Other Ambulatory Visit: Payer: Self-pay

## 2019-08-24 ENCOUNTER — Encounter: Payer: Self-pay | Admitting: Emergency Medicine

## 2019-08-24 DIAGNOSIS — Z20822 Contact with and (suspected) exposure to covid-19: Secondary | ICD-10-CM | POA: Diagnosis not present

## 2019-08-24 DIAGNOSIS — M797 Fibromyalgia: Secondary | ICD-10-CM | POA: Diagnosis not present

## 2019-08-24 DIAGNOSIS — K219 Gastro-esophageal reflux disease without esophagitis: Secondary | ICD-10-CM | POA: Insufficient documentation

## 2019-08-24 DIAGNOSIS — J45909 Unspecified asthma, uncomplicated: Secondary | ICD-10-CM | POA: Insufficient documentation

## 2019-08-24 DIAGNOSIS — J0100 Acute maxillary sinusitis, unspecified: Secondary | ICD-10-CM | POA: Insufficient documentation

## 2019-08-24 DIAGNOSIS — R05 Cough: Secondary | ICD-10-CM | POA: Diagnosis present

## 2019-08-24 DIAGNOSIS — Z79899 Other long term (current) drug therapy: Secondary | ICD-10-CM | POA: Diagnosis not present

## 2019-08-24 DIAGNOSIS — Z7901 Long term (current) use of anticoagulants: Secondary | ICD-10-CM | POA: Diagnosis not present

## 2019-08-24 DIAGNOSIS — R059 Cough, unspecified: Secondary | ICD-10-CM

## 2019-08-24 DIAGNOSIS — I10 Essential (primary) hypertension: Secondary | ICD-10-CM | POA: Diagnosis not present

## 2019-08-24 LAB — SARS CORONAVIRUS 2 (TAT 6-24 HRS): SARS Coronavirus 2: NEGATIVE

## 2019-08-24 MED ORDER — DOXYCYCLINE HYCLATE 100 MG PO TABS: 100.0000 mg | ORAL_TABLET | Freq: Two times a day (BID) | ORAL | 0 refills | Status: DC

## 2019-08-24 NOTE — ED Provider Notes (Signed)
[facility]    CSN: [id number] Arrival date & time: 08/24/19  0835      History   Chief Complaint Chief Complaint  Patient presents with  . Cough    HPI Phyllis Park is a 49 y.o. female.   49 yo female with a c/o sinus pressure, nasal congestion, and cough for the past week. Denies any fevers, chills, chest pains, shortness of breath. Has been taking otc medications without relief. Has had her covid vaccines.    Cough   Past Medical History:  Diagnosis Date  . Asthma   . Fibromyalgia   . GERD (gastroesophageal reflux disease)   . Headache    migraines  . Heart murmur   . Herniated lumbar intervertebral disc   . Hypertension   . PONV (postoperative nausea and vomiting)   . Sleep apnea    not diagnosed via a study    Patient Active Problem List   Diagnosis Date Noted  . Degenerative spondylolisthesis 08/12/2016    Past Surgical History:  Procedure Laterality Date  . ABDOMINAL HYSTERECTOMY    . BACK SURGERY    . BILATERAL CARPAL TUNNEL RELEASE    . bladder tact    . TUBAL LIGATION      OB History   No obstetric history on file.      Home Medications    Prior to Admission medications   Medication Sig Start Date End Date Taking? Authorizing Provider  AIMOVIG 140 DOSE 70 MG/ML SOAJ INJECT 2 SYRINGES Q 28 DAYS 04/09/17  Yes [provider]  albuterol (VENTOLIN HFA) 108 (90 Base) MCG/ACT inhaler Inhale 2 puffs into the lungs every 4 (four) hours as needed. For wheezing or shortness of breath. 06/30/15  Yes [provider]  budesonide-formoterol (SYMBICORT) 160-4.5 MCG/ACT inhaler Inhale 2 puffs into the lungs 2 (two) times daily.   Yes [provider]  buPROPion (WELLBUTRIN) 75 MG tablet Take 75 mg by mouth 2 (two) times daily.   Yes [provider]  Cyanocobalamin (RA VITAMIN B12) 2000 MCG TBCR Take 2,000 mcg by mouth at bedtime.   Yes [provider]  esomeprazole (NEXIUM 24HR) 20 MG capsule  Take 40 mg by mouth at bedtime.   Yes [provider]  fluticasone (FLONASE) 50 MCG/ACT nasal spray Place into the nose.   Yes [provider]  gabapentin (NEURONTIN) 300 MG capsule Take 300 mg by mouth 3 (three) times daily.   Yes [provider]  lisinopril (PRINIVIL,ZESTRIL) 10 MG tablet Take 10 mg by mouth at bedtime.    Yes [provider]  predniSONE (DELTASONE) 20 MG tablet 3 tabs po once day 1, then 2 tabs po qd x 2 days, then 1 tab po qd x 2 days, then half a tab po qd x 2 days 04/10/18  Yes Norval Gable, MD  Vitamin D, Ergocalciferol, (DRISDOL) 50000 units CAPS capsule Take 50,000 Units by mouth every Tuesday. 07/17/16  Yes [provider]  vitamin E (VITAMIN E) 400 UNIT capsule Take 800 Units by mouth at bedtime.   Yes [provider]  buprenorphine (BUTRANS) 10 MCG/HR PTWK 1 patch once a week. 08/20/19   [provider]  cephALEXin (KEFLEX) 500 MG capsule Take 1 capsule (500 mg total) by mouth 3 (three) times daily. 04/28/17   Norval Gable, MD  cyclobenzaprine (FLEXERIL) 10 MG tablet Take 10 mg by mouth 3 (three) times daily as needed. For pain. 07/26/16   [provider]  diclofenac  sodium (VOLTAREN) 1 % GEL APP 2 GRAMS EXT AA QID 04/10/17   [provider]  doxycycline (VIBRA-TABS) 100 MG tablet Take 1 tablet (100 mg total) by mouth 2 (two) times daily. 08/24/19   Norval Gable, MD  HYDROcodone-acetaminophen (NORCO/VICODIN) 5-325 MG tablet Take 1 tablet by mouth every 8 (eight) hours as needed. For pain. 07/20/16   [provider]  OnabotulinumtoxinA (BOTOX IJ) Inject as directed every 3 (three) months. 07/08/16   [provider]  oxyCODONE (OXY IR/ROXICODONE) 5 MG immediate release tablet Take 1-2 tablets (5-10 mg total) by mouth every 3 (three) hours as needed for severe pain. 08/13/16   Kristeen Miss, MD  SF 5000 PLUS 1.1 % CREA dental cream Place 1 application onto teeth at bedtime. 05/27/16    [provider]    Family History Family History  Problem Relation Age of Onset  . Breast cancer Maternal Aunt     Social History Social History   Tobacco Use  . Smoking status: Never Smoker  . Smokeless tobacco: Never Used  Vaping Use  . Vaping Use: Never used  Substance Use Topics  . Alcohol use: Yes    Comment: occassional  . Drug use: No     Allergies   Penicillins, Aspirin, Erythromycin, Morphine and related, and Naproxen   Review of Systems Review of Systems  Respiratory: Positive for cough.      Physical Exam Triage Vital Signs ED Triage Vitals  Enc Vitals Group     BP 08/24/19 0854 (!) 143/88     Pulse Rate 08/24/19 0854 91     Resp 08/24/19 0854 14     Temp 08/24/19 0854 98.2 F (36.8 C)     Temp Source 08/24/19 0854 Oral     SpO2 08/24/19 0854 97 %     Weight 08/24/19 0848 145 lb (65.8 kg)     Height 08/24/19 0848 5\' 1"  (1.549 m)     Head Circumference --      Peak Flow --      Pain Score 08/24/19 0848 4     Pain Loc --      Pain Edu? --      Excl. in [place]? --    No data found.  Updated Vital Signs BP (!) 143/88 (BP Location: Right Arm)   Pulse 91   Temp 98.2 F (36.8 C) (Oral)   Resp 14   Ht 5\' 1"  (1.549 m)   Wt 65.8 kg   SpO2 97%   BMI 27.40 kg/m   Visual Acuity Right Eye Distance:   Left Eye Distance:   Bilateral Distance:    Right Eye Near:   Left Eye Near:    Bilateral Near:     Physical Exam Vitals and nursing note reviewed.  Constitutional:      General: She is not in acute distress.    Appearance: She is not toxic-appearing or diaphoretic.  HENT:     Right Ear: Tympanic membrane normal.     Left Ear: Tympanic membrane normal.     Nose: Congestion present.     Right Sinus: Maxillary sinus tenderness present.     Left Sinus: Maxillary sinus tenderness present.     Mouth/Throat:     Pharynx: Oropharynx is clear.  Cardiovascular:     Rate and Rhythm: Normal rate.     Heart sounds: Normal heart sounds.    Pulmonary:     Effort: Pulmonary effort is normal. No respiratory distress.  Breath sounds: Normal breath sounds.  Neurological:     Mental Status: She is alert.      UC Treatments / Results  Labs (all labs ordered are listed, but only abnormal results are displayed) Labs Reviewed  SARS CORONAVIRUS 2 (TAT 6-24 HRS)    EKG   Radiology No results found.  Procedures Procedures (including critical care time)  Medications Ordered in UC Medications - No data to display  Initial Impression / Assessment and Plan / UC Course  I have reviewed the triage vital signs and the nursing notes.  Pertinent labs & imaging results that were available during my care of the patient were reviewed by me and considered in my medical decision making (see chart for details).      Final Clinical Impressions(s) / UC Diagnoses   Final diagnoses:  Acute maxillary sinusitis, recurrence not specified  Cough    ED Prescriptions    Medication Sig Dispense Auth. Provider   doxycycline (VIBRA-TABS) 100 MG tablet Take 1 tablet (100 mg total) by mouth 2 (two) times daily. 20 tablet Norval Gable, MD      1. diagnosis reviewed with patient 2. rx as per orders above; reviewed possible side effects, interactions, risks and benefits  3. Recommend supportive treatment with otc steroid nasal spray 4. Follow-up prn if symptoms worsen or don't improve   PDMP not reviewed this encounter.   Norval Gable, MD 08/24/19 1036

## 2019-08-24 NOTE — ED Triage Notes (Signed)
Patient c/o cough, congestion and sinus pressure that started 5 days ago.  Patient denies fevers.  Patient has had her COVID vaccine.

## 2020-02-16 ENCOUNTER — Ambulatory Visit: Admit: 2020-02-16 | Discharge status: Against Medical Advice | Payer: Commercial Managed Care - PPO

## 2020-03-13 ENCOUNTER — Ambulatory Visit
Admission: EM | Admit: 2020-03-13 | Discharge: 2020-03-13 | Disposition: A | Discharge status: Home / Self Care | Payer: Commercial Managed Care - PPO | Attending: Physician Assistant | Admitting: Physician Assistant

## 2020-03-13 ENCOUNTER — Encounter: Payer: Self-pay | Admitting: Emergency Medicine

## 2020-03-13 ENCOUNTER — Other Ambulatory Visit: Payer: Self-pay

## 2020-03-13 DIAGNOSIS — N898 Other specified noninflammatory disorders of vagina: Secondary | ICD-10-CM

## 2020-03-13 MED ORDER — CEPHALEXIN 500 MG PO CAPS: 500.0000 mg | ORAL_CAPSULE | Freq: Two times a day (BID) | ORAL | 0 refills | Status: AC

## 2020-03-13 NOTE — ED Provider Notes (Signed)
[facility]    CSN: [id number] Arrival date & time: 03/13/20  1115      History   Chief Complaint Chief Complaint  Patient presents with  . Cyst    HPI Phyllis Park is a 50 y.o. female.   Patient is a 50 year old female who presents with complaint of a lump on the outside of her vagina that she first noted there for yesterday.  Patient states that it is itchy but not causing her pain.  She reports is uncomfortable and rubbing.  She denies any discharge.  She has had nothing similar in the past.  She states she worked in [employer] for 7 years and does not remember seeing anything similar.  Patient denies any urinary symptoms.     Past Medical History:  Diagnosis Date  . Asthma   . Fibromyalgia   . GERD (gastroesophageal reflux disease)   . Headache    migraines  . Heart murmur   . Herniated lumbar intervertebral disc   . Hypertension   . PONV (postoperative nausea and vomiting)   . Sleep apnea    not diagnosed via a study    Patient Active Problem List   Diagnosis Date Noted  . Degenerative spondylolisthesis 08/12/2016    Past Surgical History:  Procedure Laterality Date  . ABDOMINAL HYSTERECTOMY    . BACK SURGERY    . BILATERAL CARPAL TUNNEL RELEASE    . bladder tact    . TUBAL LIGATION      OB History   No obstetric history on file.      Home Medications    Prior to Admission medications   Medication Sig Start Date End Date Taking? Authorizing Provider  cephALEXin (KEFLEX) 500 MG capsule Take 1 capsule (500 mg total) by mouth 2 (two) times daily for 10 days. 03/13/20 03/23/20 Yes Luvenia Redden, PA-C  AIMOVIG 140 DOSE 70 MG/ML SOAJ INJECT 2 SYRINGES Q 28 DAYS 04/09/17   [provider]  albuterol (VENTOLIN HFA) 108 (90 Base) MCG/ACT inhaler Inhale 2 puffs into the lungs every 4 (four) hours as needed. For wheezing or shortness of breath. 06/30/15   [provider]  budesonide-formoterol (SYMBICORT) 160-4.5 MCG/ACT inhaler  Inhale 2 puffs into the lungs 2 (two) times daily.    [provider]  buprenorphine (BUTRANS) 10 MCG/HR PTWK 1 patch once a week. 08/20/19   [provider]  buPROPion (WELLBUTRIN) 75 MG tablet Take 75 mg by mouth 2 (two) times daily.    [provider]  Cyanocobalamin (RA VITAMIN B12) 2000 MCG TBCR Take 2,000 mcg by mouth at bedtime.    [provider]  cyclobenzaprine (FLEXERIL) 10 MG tablet Take 10 mg by mouth 3 (three) times daily as needed. For pain. 07/26/16   [provider]  diclofenac sodium (VOLTAREN) 1 % GEL APP 2 GRAMS EXT AA QID 04/10/17   [provider]  esomeprazole (NEXIUM 24HR) 20 MG capsule Take 40 mg by mouth at bedtime.    [provider]  fluticasone (FLONASE) 50 MCG/ACT nasal spray Place into the nose.    [provider]  gabapentin (NEURONTIN) 300 MG capsule Take 300 mg by mouth 3 (three) times daily.    [provider]  HYDROcodone-acetaminophen (NORCO/VICODIN) 5-325 MG tablet Take 1 tablet by mouth every 8 (eight) hours as needed. For pain. 07/20/16   [provider]  lisinopril (PRINIVIL,ZESTRIL) 10 MG tablet Take 10 mg by mouth at bedtime.     [provider]  OnabotulinumtoxinA (BOTOX IJ) Inject as directed every 3 (three) months. 07/08/16   [provider]  SF 5000 PLUS 1.1 % CREA dental cream Place 1 application onto teeth at bedtime. 05/27/16   [provider]  Vitamin D, Ergocalciferol, (DRISDOL) 50000 units CAPS capsule Take 50,000 Units by mouth every Tuesday. 07/17/16   [provider]  vitamin E (VITAMIN E) 400 UNIT capsule Take 800 Units by mouth at bedtime.    [provider]    Family History Family History  Problem Relation Age of Onset  . Breast cancer Maternal Aunt     Social History Social History   Tobacco Use  . Smoking status: Never Smoker  . Smokeless tobacco: Never Used  Vaping Use  . Vaping Use: Never used   Substance Use Topics  . Alcohol use: Yes    Comment: occassional  . Drug use: No     Allergies   Penicillins, Aspirin, Erythromycin, Morphine and related, and Naproxen   Review of Systems Review of Systems as noted above in HPI.  Other systems reviewed and found to be negative   Physical Exam Triage Vital Signs ED Triage Vitals  Enc Vitals Group     BP 03/13/20 1126 139/90     Pulse Rate 03/13/20 1126 (!) 116     Resp 03/13/20 1126 14     Temp 03/13/20 1126 98.5 F (36.9 C)     Temp Source 03/13/20 1126 Oral     SpO2 03/13/20 1126 97 %     Weight 03/13/20 1123 145 lb (65.8 kg)     Height 03/13/20 1123 5\' 1"  (1.549 m)     Head Circumference --      Peak Flow --      Pain Score 03/13/20 1122 2     Pain Loc --      Pain Edu? --      Excl. in [place]? --    No data found.  Updated Vital Signs BP 139/90 (BP Location: Left Arm)   Pulse (!) 116   Temp 98.5 F (36.9 C) (Oral)   Resp 14   Ht 5\' 1"  (1.549 m)   Wt 145 lb (65.8 kg)   SpO2 97%   BMI 27.40 kg/m   Physical Exam Exam conducted with a chaperone present Advertising account planner, Therapist, sports).  Constitutional:      Appearance: Normal appearance.  Genitourinary:   Neurological:     General: No focal deficit present.     Mental Status: She is oriented to person, place, and time.      UC Treatments / Results  Labs (all labs ordered are listed, but only abnormal results are displayed) Labs Reviewed - No data to display  EKG   Radiology No results found.  Procedures Procedures (including critical care time)  Medications Ordered in UC Medications - No data to display  Initial Impression / Assessment and Plan / UC Course  I have reviewed the triage vital signs and the nursing notes.  Pertinent labs & imaging results that were available during my care of the patient were reviewed by me and considered in my medical decision making (see chart for details).    Firm nodule noted under the skin where the labia meet  superiorly.  No erythema or drainage noted.  No area that could be tapped for drainage.  Patient denies any outright pain, more uncomfortable and rubbing.  We will give her a course of antibiotics.  Have her follow-up with  OB if no improvement.  Final Clinical Impressions(s) / UC Diagnoses   Final diagnoses:  Vaginal lesion     Discharge Instructions     -Keflex: one tablet twice a day for 10 days -warm compresses for 15-20 minutes 4-5 times a day -follow up with OB, sooner if no improvement with antibiotics and warm compresses    ED Prescriptions    Medication Sig Dispense Auth. Provider   cephALEXin (KEFLEX) 500 MG capsule Take 1 capsule (500 mg total) by mouth 2 (two) times daily for 10 days. 20 capsule Luvenia Redden, PA-C     PDMP not reviewed this encounter.   Luvenia Redden, PA-C 03/13/20 1159

## 2020-03-13 NOTE — ED Triage Notes (Signed)
Patient c/o tender bump on the top center of her vaginal opening that started 2 days ago.  Patient denies vaginal discharge or drainage.

## 2020-03-13 NOTE — Discharge Instructions (Signed)
-  Keflex: one tablet twice a day for 10 days -warm compresses for 15-20 minutes 4-5 times a day -follow up with OB, sooner if no improvement with antibiotics and warm compresses

## 2020-12-17 ENCOUNTER — Other Ambulatory Visit (HOSPITAL_BASED_OUTPATIENT_CLINIC_OR_DEPARTMENT_OTHER): Payer: Self-pay | Admitting: Orthopedic Surgery

## 2020-12-17 ENCOUNTER — Other Ambulatory Visit: Payer: Self-pay | Admitting: Orthopedic Surgery

## 2021-05-21 ENCOUNTER — Other Ambulatory Visit: Payer: Self-pay | Admitting: Orthopedic Surgery

## 2021-05-21 DIAGNOSIS — M5412 Radiculopathy, cervical region: Secondary | ICD-10-CM

## 2021-05-28 ENCOUNTER — Other Ambulatory Visit: Payer: Self-pay | Admitting: Pain Medicine

## 2021-05-28 ENCOUNTER — Other Ambulatory Visit: Payer: Self-pay | Admitting: Orthopedic Surgery

## 2021-05-28 DIAGNOSIS — M5416 Radiculopathy, lumbar region: Secondary | ICD-10-CM

## 2021-06-02 ENCOUNTER — Ambulatory Visit
Admission: RE | Admit: 2021-06-02 | Discharge: 2021-06-02 | Disposition: A | Discharge status: Home / Self Care | Payer: BC Managed Care – PPO | Source: Ambulatory Visit | Attending: Orthopedic Surgery | Admitting: Orthopedic Surgery

## 2021-06-02 DIAGNOSIS — M5412 Radiculopathy, cervical region: Secondary | ICD-10-CM

## 2021-06-03 ENCOUNTER — Ambulatory Visit
Admission: RE | Admit: 2021-06-03 | Discharge: 2021-06-03 | Disposition: A | Discharge status: Home / Self Care | Payer: Commercial Managed Care - PPO | Source: Ambulatory Visit | Attending: Pain Medicine | Admitting: Pain Medicine

## 2021-06-03 DIAGNOSIS — M5416 Radiculopathy, lumbar region: Secondary | ICD-10-CM

## 2021-06-03 MED ORDER — GADOBENATE DIMEGLUMINE 529 MG/ML IV SOLN
13.0000 mL | Freq: Once | INTRAVENOUS | Status: AC | PRN
  Administered 2021-06-03: 13 mL via INTRAVENOUS

## 2021-06-11 ENCOUNTER — Other Ambulatory Visit: Payer: Self-pay | Admitting: Neurosurgery

## 2021-06-17 ENCOUNTER — Encounter (HOSPITAL_COMMUNITY): Payer: Self-pay | Admitting: Vascular Surgery

## 2021-06-17 NOTE — Pre-Procedure Instructions (Signed)
Surgical Instructions ? ? ? Your procedure is scheduled on Monday, May 15th. ? Report to [facility] Main Entrance "A" at 10:25 A.M., then check in with the Admitting office. ? Call this number if you have problems the morning of surgery: ? [phone] ? ? If you have any questions prior to your surgery date call [phone]: Open Monday-Friday 8am-4pm ? ? ? Remember: ? Do not eat or drink after midnight the night before your surgery ?  ? Take these medicines the morning of surgery with A SIP OF WATER  ?budesonide-formoterol (SYMBICORT) ?buPROPion [facility])  ?finasteride (PROSCAR) ?gabapentin (NEURONTIN)  ?metoprolol succinate (TOPROL-XL) 25  ?rosuvastatin (CRESTOR)  ? ?As needed: ?Albuterol inhaler ?Flonase nasal spray ?guaiFENesin (MUCINEX)  ?oxyCODONE (OXY IR/ROXICODONE) ? ?Please bring inhalers with you to the hospital.  ? ?Per anesthesia, you will need to hold buprenorphine (BUTRANS) for 3 days pre-op. Please talk to your primary doctor about discontinuing this medication prior to surgery. ? ?As of today, STOP taking any Aspirin (unless otherwise instructed by your surgeon) Aleve, Naproxen, Ibuprofen, Motrin, Advil, Goody's, BC's, all herbal medications, fish oil, and all vitamins. This includes: diclofenac sodium (VOLTAREN) GEL. ?         ?           ?Do NOT Smoke (Tobacco/Vaping) for 24 hours prior to your procedure. ? ?If you use a CPAP at night, you may bring your mask/headgear for your overnight stay. ?  ?Contacts, glasses, piercing's, hearing aid's, dentures or partials may not be worn into surgery, please bring cases for these belongings.  ?  ?For patients admitted to the hospital, discharge time will be determined by your treatment team. ?  ?Patients discharged the day of surgery will not be allowed to drive home, and someone needs to stay with them for 24 hours. ? ?SURGICAL WAITING ROOM VISITATION ?Patients having surgery or a procedure may have two support people in the waiting room. These  visitors may be switched out with other visitors if needed. ?Children under the age of 31 must have an adult accompany them who is not the patient. ?If the patient needs to stay at the hospital during part of their recovery, the visitor guidelines for inpatient rooms apply. ? ?Please refer to the [place] website for the visitor guidelines for Inpatients (after your surgery is over and you are in a regular room).  ? ? ?Special instructions:   ?[facility]- Preparing For Surgery ? ?Before surgery, you can play an important role. Because skin is not sterile, your skin needs to be as free of germs as possible. You can reduce the number of germs on your skin by washing with CHG (chlorahexidine gluconate) Soap before surgery.  CHG is an antiseptic cleaner which kills germs and bonds with the skin to continue killing germs even after washing.   ? ?Oral Hygiene is also important to reduce your risk of infection.  Remember - BRUSH YOUR TEETH THE MORNING OF SURGERY WITH YOUR REGULAR TOOTHPASTE ? ?Please do not use if you have an allergy to CHG or antibacterial soaps. If your skin becomes reddened/irritated stop using the CHG.  ?Do not shave (including legs and underarms) for at least 48 hours prior to first CHG shower. It is OK to shave your face. ? ?Please follow these instructions carefully. ?  ?Shower the NIGHT BEFORE SURGERY and the MORNING OF SURGERY ? ?If you chose to wash your hair, wash your hair first as usual with your normal shampoo. ? ?After you  shampoo, rinse your hair and body thoroughly to remove the shampoo. ? ?Use CHG Soap as you would any other liquid soap. You can apply CHG directly to the skin and wash gently with a scrungie or a clean washcloth.  ? ?Apply the CHG Soap to your body ONLY FROM THE NECK DOWN.  Do not use on open wounds or open sores. Avoid contact with your eyes, ears, mouth and genitals (private parts). Wash Face and genitals (private parts)  with your normal soap.  ? ?Wash thoroughly,  paying special attention to the area where your surgery will be performed. ? ?Thoroughly rinse your body with warm water from the neck down. ? ?DO NOT shower/wash with your normal soap after using and rinsing off the CHG Soap. ? ?Pat yourself dry with a CLEAN TOWEL. ? ?Wear CLEAN PAJAMAS to bed the night before surgery ? ?Place CLEAN SHEETS on your bed the night before your surgery ? ?DO NOT SLEEP WITH PETS. ? ? ?Day of Surgery: ?Take a shower with CHG soap. ?Do not wear jewelry or makeup ?Do not wear lotions, powders, perfumes, or deodorant. ?Do not shave 48 hours prior to surgery.   ?Do not bring valuables to the hospital.  ?[facility] is not responsible for any belongings or valuables. ?Do not wear nail polish, gel polish, artificial nails, or any other type of covering on natural nails (fingers and toes) ?If you have artificial nails or gel coating that need to be removed by a nail salon, please have this removed prior to surgery. Artificial nails or gel coating may interfere with anesthesia's ability to adequately monitor your vital signs. ?Wear Clean/Comfortable clothing the morning of surgery ?Remember to brush your teeth WITH YOUR REGULAR TOOTHPASTE. ?  ?Please read over the following fact sheets that you were given. ? ? ? ?If you received a COVID test during your pre-op visit  it is requested that you wear a mask when out in public, stay away from anyone that may not be feeling well and notify your surgeon if you develop symptoms. If you have been in contact with anyone that has tested positive in the last 10 days please notify you surgeon. ? ?

## 2021-06-18 ENCOUNTER — Encounter (HOSPITAL_COMMUNITY): Payer: Self-pay

## 2021-06-18 ENCOUNTER — Encounter (HOSPITAL_COMMUNITY)
Admission: RE | Admit: 2021-06-18 | Discharge: 2021-06-18 | Disposition: A | Discharge status: Home / Self Care | Payer: BC Managed Care – PPO | Source: Ambulatory Visit | Attending: Neurosurgery | Admitting: Neurosurgery

## 2021-06-18 ENCOUNTER — Other Ambulatory Visit: Payer: Self-pay

## 2021-06-18 VITALS — BP 109/70 | Temp 98.0°F | Resp 17 | Ht 61.0 in | Wt 143.4 lb

## 2021-06-18 DIAGNOSIS — I10 Essential (primary) hypertension: Secondary | ICD-10-CM | POA: Diagnosis not present

## 2021-06-18 DIAGNOSIS — Z90710 Acquired absence of both cervix and uterus: Secondary | ICD-10-CM | POA: Insufficient documentation

## 2021-06-18 DIAGNOSIS — Z8541 Personal history of malignant neoplasm of cervix uteri: Secondary | ICD-10-CM | POA: Insufficient documentation

## 2021-06-18 DIAGNOSIS — Z01818 Encounter for other preprocedural examination: Secondary | ICD-10-CM | POA: Insufficient documentation

## 2021-06-18 DIAGNOSIS — K219 Gastro-esophageal reflux disease without esophagitis: Secondary | ICD-10-CM | POA: Diagnosis not present

## 2021-06-18 DIAGNOSIS — M48061 Spinal stenosis, lumbar region without neurogenic claudication: Secondary | ICD-10-CM | POA: Insufficient documentation

## 2021-06-18 DIAGNOSIS — I340 Nonrheumatic mitral (valve) insufficiency: Secondary | ICD-10-CM | POA: Diagnosis not present

## 2021-06-18 DIAGNOSIS — M5126 Other intervertebral disc displacement, lumbar region: Secondary | ICD-10-CM | POA: Insufficient documentation

## 2021-06-18 DIAGNOSIS — M797 Fibromyalgia: Secondary | ICD-10-CM | POA: Diagnosis not present

## 2021-06-18 HISTORY — DX: Malignant (primary) neoplasm, unspecified: C80.1

## 2021-06-18 LAB — BASIC METABOLIC PANEL
Anion gap: 7 (ref 5–15)
BUN: 16 mg/dL (ref 6–20)
CO2: 29 mmol/L (ref 22–32)
Calcium: 9.4 mg/dL (ref 8.9–10.3)
Chloride: 101 mmol/L (ref 98–111)
Creatinine, Ser: 0.95 mg/dL (ref 0.44–1.00)
GFR, Estimated: >60 mL/min (ref >60)
Glucose, Bld: 100 mg/dL — ABNORMAL HIGH (ref 70–99)
Potassium: 3.6 mmol/L (ref 3.5–5.1)
Sodium: 137 mmol/L (ref 135–145)

## 2021-06-18 LAB — CBC
HCT: 36.3 % (ref 36.0–46.0)
Hemoglobin: 12.2 g/dL (ref 12.0–15.0)
MCH: 30.6 pg (ref 26.0–34.0)
MCHC: 33.6 g/dL (ref 30.0–36.0)
MCV: 91 fL (ref 80.0–100.0)
Platelets: 178 10*3/uL (ref 150–400)
RBC: 3.99 MIL/uL (ref 3.87–5.11)
RDW: 12.1 % (ref 11.5–15.5)
WBC: 4.8 10*3/uL (ref 4.0–10.5)
nRBC: 0 % (ref 0.0–0.2)

## 2021-06-18 LAB — TYPE AND SCREEN
ABO/RH(D): A POS
Antibody Screen: NEGATIVE

## 2021-06-18 LAB — SURGICAL PCR SCREEN
MRSA, PCR: NEGATIVE
Staphylococcus aureus: NEGATIVE

## 2021-06-18 NOTE — Progress Notes (Addendum)
PCP - Heron Nay, MD ?Cardiologist - Isaias Cowman, MD ? ?PPM/ICD - denies ?Device Orders - n/a ?Rep Notified - n/a ? ?Chest x-ray - n/a ?EKG - 06/18/2021 ?Stress Test - denies ?ECHO - 12/11/2019 - [facility] ?Cardiac Cath - denies ? ?Sleep Study - patient denied having a sleep study done (OSA is documented in the chart) ?CPAP - no ? ?Fasting Blood Sugar - n/a ? ?Blood Thinner Instructions: n/a ? ?Aspirin Instructions: Patient was instructed: As of today, STOP taking any Aspirin (unless otherwise instructed by your surgeon) Aleve, Naproxen, Ibuprofen, Motrin, Advil, Goody's, BC's, all herbal medications, fish oil, and all vitamins. This includes: diclofenac sodium (VOLTAREN) GEL. ? ?ERAS Protcol - n/a ? ? ?COVID TEST- n/a ? ? ?Anesthesia review: yes - history of heart murmur. Patient denied any acute distress. ? ?Patient denies shortness of breath, fever, cough and chest pain at PAT appointment ? ? ?All instructions explained to the patient, with a verbal understanding of the material. Patient agrees to go over the instructions while at home for a better understanding. Patient also instructed to self quarantine after being tested for COVID-19. The opportunity to ask questions was provided. ?  ?

## 2021-06-18 NOTE — Anesthesia Preprocedure Evaluation (Deleted)
Anesthesia Evaluation    Airway        Dental   Pulmonary           Cardiovascular hypertension,      Neuro/Psych    GI/Hepatic   Endo/Other    Renal/GU      Musculoskeletal   Abdominal   Peds  Hematology   Anesthesia Other Findings   Reproductive/Obstetrics                             Anesthesia Physical Anesthesia Plan  ASA:   Anesthesia Plan:    Post-op Pain Management:    Induction:   PONV Risk Score and Plan:   Airway Management Planned:   Additional Equipment:   Intra-op Plan:   Post-operative Plan:   Informed Consent:   Plan Discussed with:   Anesthesia Plan Comments: (PAT note written 06/18/2021 by Myra Gianotti, PA-C. )        Anesthesia Quick Evaluation

## 2021-06-18 NOTE — Progress Notes (Signed)
Anesthesia Chart Review: ? Case: [id number] Date/Time: 06/21/21 1210  ? Procedure: PLIF - L4-L5 (Back)  ? Anesthesia type: General  ? Pre-op diagnosis: Stenosis  ? Location: [facility] ROOM 19 / [facility] OR  ? Surgeons: Earnie Larsson, MD  ? ?  ? ? ?DISCUSSION: Patient is a 51 year old female scheduled for the above procedure. ? ?History includes never smoker, post-operative N/V, HTN, asthma, murmur (mild MR 12/11/19 echo), GERD, fibromyalgia, cervical cancer (s/p hysterectomy), spinal surgery (L5-S1 PLIF 08/12/16). OSA is listed, but she denied having a prior sleep study.   ? ?Last visit with cardiologist Dr. Saralyn Pilar Jefm Bryant) was on 02/18/21 for follow-up palpitations that improved since starting metoprolol. Had reassuring 72 hour monitor and echo in 2021.  ? ?Anesthesia team to evaluate on the day of surgery.  ? ? ?VS: BP 109/70   Temp 36.7 ?C (Oral)   Resp 17   Ht '5\' 1"'$  (1.549 m)   Wt 65 kg   SpO2 97%   BMI 27.10 kg/m?  ? ?PROVIDERS: ?Sofie Hartigan, MD is PCP  ?Isaias Cowman, MD is cardiologist ?Jennings Books, MD is neurologist ?Olean Ree, MD is GI ? ? ?LABS: Labs reviewed: Acceptable for surgery. A1c 5.6% and CMET normal on 05/20/20 ([facility]).  ?(all labs ordered are listed, but only abnormal results are displayed) ? ?Labs Reviewed  ?BASIC METABOLIC PANEL - Abnormal; Notable for the following components:  ?    Result Value  ? Glucose, Bld 100 (*)   ? All other components within normal limits  ?SURGICAL PCR SCREEN  ?CBC  ?TYPE AND SCREEN  ? ? ? ?IMAGES: ?MRI L-spine 06/03/21: ?IMPRESSION: ?Interval development of moderately large central and right-sided ?disc protrusion L4-5. Downgoing extruded disc fragment on the right. ?Moderate spinal stenosis ?PLIF L5-S1 without stenosis. ?  ?MRI C-spine 06/02/21: ?IMPRESSION: ?Degenerative facet spurring asymmetric to the left. Mild lower ?cervical disc bulging. No neural impingement or visible ?inflammation. ?  ? ?EKG: 06/18/21: NSR. Low voltage QRS. ? ? ?CV: ?Echo 12/11/19  ([facility]): ?INTERPRETATION  ?NORMAL LEFT VENTRICULAR SYSTOLIC FUNCTION  ?NORMAL RIGHT VENTRICULAR SYSTOLIC FUNCTION  ?MILD VALVULAR REGURGITATION (trivial AR, mild MR, trivial TR, trivial PR)  ?NO VALVULAR STENOSIS  ? ?Per 02/18/21 cardiology note by Dr. Saralyn Pilar [facility]): "72-hour Holter monitor starting on 12/02/2019 revealed predominant sinus rhythm with a mean heart rate of 82 bpm. Heart rate ranged from 65-128 bpm. There were no atrial or ventricular ectopy. There was no correlation with diary entries. She did experience frequent palpitations while wearing the monitor. 2D echocardiogram on 12/11/2019 revealed normal left ventricular function with LVEF greater than 55% with normal wall motion with grade 1 diastolic dysfunction with mild mitral regurgitation." ? ? ?Past Medical History:  ?Diagnosis Date  ? Asthma   ? Cancer [facility])   ? cervical  ? Fibromyalgia   ? GERD (gastroesophageal reflux disease)   ? Headache   ? migraines  ? Heart murmur   ? Herniated lumbar intervertebral disc   ? Hypertension   ? PONV (postoperative nausea and vomiting)   ? Sleep apnea   ? not diagnosed via a study  ? ? ?Past Surgical History:  ?Procedure Laterality Date  ? ABDOMINAL HYSTERECTOMY    ? BACK SURGERY    ? BILATERAL CARPAL TUNNEL RELEASE    ? bladder tact    ? TUBAL LIGATION    ? ? ?MEDICATIONS: ? albuterol (VENTOLIN HFA) 108 (90 Base) MCG/ACT inhaler  ? budesonide-formoterol (SYMBICORT) 160-4.5 MCG/ACT inhaler  ? buprenorphine (  BUTRANS) 15 MCG/HR  ? buPROPion (WELLBUTRIN) 75 MG tablet  ? cyanocobalamin 1000 MCG tablet  ? diclofenac sodium (VOLTAREN) 1 % GEL  ? Erenumab-aooe 70 MG/ML SOAJ  ? esomeprazole (NEXIUM) 20 MG capsule  ? finasteride (PROSCAR) 5 MG tablet  ? fluticasone (FLONASE) 50 MCG/ACT nasal spray  ? gabapentin (NEURONTIN) 300 MG capsule  ? guaiFENesin (MUCINEX) 600 MG 12 hr tablet  ? hydrochlorothiazide (HYDRODIURIL) 25 MG tablet  ? lisinopril (PRINIVIL,ZESTRIL) 10 MG tablet  ? metoprolol succinate (TOPROL-XL) 25  MG 24 hr tablet  ? oxyCODONE (OXY IR/ROXICODONE) 5 MG immediate release tablet  ? POTASSIUM PO  ? rosuvastatin (CRESTOR) 5 MG tablet  ? SF 5000 PLUS 1.1 % CREA dental cream  ? tiZANidine (ZANAFLEX) 4 MG tablet  ? Vitamin D, Ergocalciferol, (DRISDOL) 50000 units CAPS capsule  ? vitamin E 180 MG (400 UNITS) capsule  ? ?No current facility-administered medications for this encounter.  ? ? ?Myra Gianotti, PA-C ?Surgical Short Stay/Anesthesiology ?[facility] Phone [phone] ?[facility] Phone [phone] ?06/18/2021 4:36 PM ? ? ? ? ? ? ? ?

## 2021-07-02 ENCOUNTER — Ambulatory Visit (HOSPITAL_COMMUNITY): Admission: RE | Admit: 2021-07-02 | Payer: BC Managed Care – PPO | Source: Home / Self Care | Admitting: Neurosurgery

## 2021-07-02 ENCOUNTER — Encounter (HOSPITAL_COMMUNITY): Admission: RE | Payer: Self-pay | Source: Home / Self Care

## 2021-07-02 SURGERY — POSTERIOR LUMBAR FUSION 1 LEVEL
Anesthesia: General | Site: Back

## 2021-07-06 ENCOUNTER — Emergency Department (HOSPITAL_COMMUNITY)
Admission: EM | Admit: 2021-07-06 | Discharge: 2021-07-06 | Disposition: A | Discharge status: Home / Self Care | Payer: BC Managed Care – PPO | Attending: Emergency Medicine | Admitting: Emergency Medicine

## 2021-07-06 ENCOUNTER — Encounter (HOSPITAL_COMMUNITY): Payer: Self-pay

## 2021-07-06 ENCOUNTER — Other Ambulatory Visit: Payer: Self-pay

## 2021-07-06 ENCOUNTER — Emergency Department (HOSPITAL_COMMUNITY): Payer: BC Managed Care – PPO

## 2021-07-06 DIAGNOSIS — M5441 Lumbago with sciatica, right side: Secondary | ICD-10-CM | POA: Insufficient documentation

## 2021-07-06 DIAGNOSIS — W109XXA Fall (on) (from) unspecified stairs and steps, initial encounter: Secondary | ICD-10-CM | POA: Diagnosis not present

## 2021-07-06 DIAGNOSIS — Z79899 Other long term (current) drug therapy: Secondary | ICD-10-CM | POA: Diagnosis not present

## 2021-07-06 DIAGNOSIS — I10 Essential (primary) hypertension: Secondary | ICD-10-CM | POA: Insufficient documentation

## 2021-07-06 DIAGNOSIS — M5431 Sciatica, right side: Secondary | ICD-10-CM

## 2021-07-06 DIAGNOSIS — Z7951 Long term (current) use of inhaled steroids: Secondary | ICD-10-CM | POA: Insufficient documentation

## 2021-07-06 DIAGNOSIS — J45909 Unspecified asthma, uncomplicated: Secondary | ICD-10-CM | POA: Insufficient documentation

## 2021-07-06 DIAGNOSIS — H00012 Hordeolum externum right lower eyelid: Secondary | ICD-10-CM | POA: Diagnosis not present

## 2021-07-06 DIAGNOSIS — M545 Low back pain, unspecified: Secondary | ICD-10-CM | POA: Diagnosis present

## 2021-07-06 MED ORDER — KETOROLAC TROMETHAMINE 30 MG/ML IJ SOLN
30.0000 mg | Freq: Once | INTRAMUSCULAR | Status: AC
Start: 2021-07-06 — End: 2021-07-06
  Administered 2021-07-06: 30 mg via INTRAMUSCULAR
  Filled 2021-07-06: qty 1

## 2021-07-06 MED ORDER — METHYLPREDNISOLONE 4 MG PO TBPK
ORAL_TABLET | ORAL | 0 refills | Status: DC
Start: 2021-07-06 — End: 2021-07-21

## 2021-07-06 MED ORDER — HYDROMORPHONE HCL 1 MG/ML IJ SOLN
2.0000 mg | Freq: Once | INTRAMUSCULAR | Status: AC
  Administered 2021-07-06: 2 mg via INTRAMUSCULAR
  Filled 2021-07-06: qty 2

## 2021-07-06 MED ORDER — OXYCODONE HCL 5 MG PO TABS
10.0000 mg | ORAL_TABLET | Freq: Once | ORAL | Status: AC
  Administered 2021-07-06: 10 mg via ORAL
  Filled 2021-07-06: qty 2

## 2021-07-06 NOTE — Discharge Instructions (Addendum)
If you develop worsening, recurrent, or continued back pain, numbness or weakness in the legs, incontinence of your bowels or bladders, numbness of your buttocks, fever, abdominal pain, or any other new/concerning symptoms then return to the ER for evaluation.  

## 2021-07-06 NOTE — ED Triage Notes (Signed)
Pt. Stated, Phyllis Park had lower back pain a year and half and I need surgery and waiting on insurance but I fell this morning because of my back pain. I had a fusion on my back 3-4 years ago. I also have a sty on my right eye that looks like its getting worse instead of better.

## 2021-07-06 NOTE — ED Provider Notes (Signed)
[facility] EMERGENCY DEPARTMENT Provider Note   CSN: [id number] Arrival date & time: 07/06/21  2841     History  Chief Complaint  Patient presents with   Fall   Back Pain    Phyllis Park is a 51 y.o. female.  HPI 51 year old female with a history of chronic back pain, asthma, hypertension presents with worsening back pain and a fall.  Patient states that she has been dealing with back pain for quite some time and was due for surgery but due her insurance not approving surgery she has been unable to get the surgery she needs.  Over the last 3 days she has had worsening pain.  Today she fell due to the pain down some stairs.  She thinks her back might have gotten hurt but she is not sure.  She has radicular pain going down the right leg which is unchanged today but overall her pain is more severe over these last 3 days.  No fevers, incontinence, or new weakness/numbness.  No abdominal pain.  She took 3 oxycodone this morning and still is in severe pain.  She is also been dealing with a right lower eyelid stye for about a week.  Has tried some cool compresses but it does not seem to be getting better.  Is not painful but just a little annoying.  Home Medications Prior to Admission medications   Medication Sig Start Date End Date Taking? Authorizing Provider  methylPREDNISolone (MEDROL DOSEPAK) 4 MG TBPK tablet Take per box instruction 07/06/21  Yes Sherwood Gambler, MD  albuterol (VENTOLIN HFA) 108 (90 Base) MCG/ACT inhaler Inhale 2 puffs into the lungs every 4 (four) hours as needed. For wheezing or shortness of breath. 06/30/15   [provider]  budesonide-formoterol (SYMBICORT) 160-4.5 MCG/ACT inhaler Inhale 2 puffs into the lungs 2 (two) times daily.    [provider]  buprenorphine (BUTRANS) 15 MCG/HR Place 1 patch onto the skin once a week. 08/20/19   [provider]  buPROPion (WELLBUTRIN) 75 MG tablet Take 75 mg by mouth 2 (two)  times daily.    [provider]  cyanocobalamin 1000 MCG tablet Take 1,000 mcg by mouth in the morning and at bedtime.    [provider]  diclofenac sodium (VOLTAREN) 1 % GEL Apply 2 g topically 4 (four) times daily as needed (pain). 04/10/17   [provider]  Erenumab-aooe 70 MG/ML SOAJ Inject 140 mg into the skin every 28 (twenty-eight) days. 04/09/17   [provider]  esomeprazole (NEXIUM) 20 MG capsule Take 40 mg by mouth at bedtime.    [provider]  finasteride (PROSCAR) 5 MG tablet Take 5 mg by mouth daily.    [provider]  fluticasone (FLONASE) 50 MCG/ACT nasal spray Place 1 spray into both nostrils daily as needed for allergies.    [provider]  gabapentin (NEURONTIN) 300 MG capsule Take 600 mg by mouth 3 (three) times daily.    [provider]  guaiFENesin (MUCINEX) 600 MG 12 hr tablet Take 600 mg by mouth daily as needed (allergies).    [provider]  hydrochlorothiazide (HYDRODIURIL) 25 MG tablet Take 25 mg by mouth daily.    [provider]  lisinopril (PRINIVIL,ZESTRIL) 10 MG tablet Take 10 mg by mouth at bedtime.     [provider]  metoprolol succinate (TOPROL-XL) 25 MG 24 hr tablet Take 12.5 mg by mouth daily.    [provider]  oxyCODONE (OXY  IR/ROXICODONE) 5 MG immediate release tablet Take 5-10 mg by mouth every 6 (six) hours as needed for pain. 06/07/21   [provider]  POTASSIUM PO Take 1 tablet by mouth daily.    [provider]  rosuvastatin (CRESTOR) 5 MG tablet Take 5 mg by mouth daily.    [provider]  SF 5000 PLUS 1.1 % CREA dental cream Place 1 application onto teeth at bedtime. 05/27/16   [provider]  tiZANidine (ZANAFLEX) 4 MG tablet Take 1-2 mg by mouth at bedtime. 05/27/21   [provider]  Vitamin D, Ergocalciferol, (DRISDOL) 50000 units CAPS capsule Take 50,000 Units by mouth 2 (two) times a week.  07/17/16   [provider]  vitamin E 180 MG (400 UNITS) capsule Take 800 Units by mouth in the morning and at bedtime.    [provider]      Allergies    Penicillins, Aspirin, Erythromycin, Morphine and related, and Naproxen    Review of Systems   Review of Systems  Constitutional:  Negative for fever.  Eyes:  Positive for redness.  Musculoskeletal:  Positive for back pain.  Neurological:  Positive for numbness. Negative for weakness.   Physical Exam Updated Vital Signs BP (!) 114/93 (BP Location: Left Arm)   Pulse 78   Temp 99.1 F (37.3 C) (Oral)   Resp 16   Ht '5\' 1"'$  (1.549 m)   Wt 64.4 kg   SpO2 97%   BMI 26.83 kg/m  Physical Exam Vitals and nursing note reviewed.  Constitutional:      Appearance: She is well-developed.     Comments: Sitting in wheelchair as pain is too strong for her to get up into stretcher  HENT:     Head: Normocephalic and atraumatic.  Eyes:     Extraocular Movements: Extraocular movements intact.     Comments: Right lower eyelid stye.  Pulmonary:     Effort: Pulmonary effort is normal.  Abdominal:     General: There is no distension.     Palpations: Abdomen is soft.     Tenderness: There is no abdominal tenderness.  Musculoskeletal:     Lumbar back: Tenderness present.       Back:  Skin:    General: Skin is warm and dry.  Neurological:     Mental Status: She is alert.     Comments: 5/5 strength in BLE. Grossly normal sensation    ED Results / Procedures / Treatments   Labs (all labs ordered are listed, but only abnormal results are displayed) Labs Reviewed - No data to display  EKG None  Radiology DG Lumbar Spine Complete  Result Date: 07/06/2021 CLINICAL DATA:  Chronic lower back pain after fall. EXAM: LUMBAR SPINE - COMPLETE 4+ VIEW COMPARISON:  July 29, 2020. FINDINGS: No fracture or spondylolisthesis is noted. Status post surgical posterior fusion of L5-S1 with bilateral intrapedicular screw placement and  interbody fusion. Moderate degenerative disc disease is noted at L4-5. IMPRESSION: Postsurgical and degenerative changes as described above. No acute abnormality seen. Electronically Signed   By: Marijo Conception M.D.   On: 07/06/2021 09:50    Procedures Procedures    Medications Ordered in ED Medications  HYDROmorphone (DILAUDID) injection 2 mg (2 mg Intramuscular Given 07/06/21 0914)  ketorolac (TORADOL) 30 MG/ML injection 30 mg (30 mg Intramuscular Given 07/06/21 1035)  oxyCODONE (Oxy IR/ROXICODONE) immediate release tablet 10 mg (10 mg Oral Given 07/06/21 1057)    ED Course/ Medical Decision  Making/ A&P                           Medical Decision Making Amount and/or Complexity of Data Reviewed External Data Reviewed: radiology and notes. Radiology: ordered and independent interpretation performed.  Risk Prescription drug management.   Patient appears to have acute on chronic back pain.  She did fall but it does not seem like there is much trauma.  X-rays were obtained and I personally viewed/interpreted these images and there is chronic hardware but no fracture or dislocation.  From a pain perspective she was given IM Toradol as well as IM Dilaudid.  She will be given some of her oral oxycodone as well.  I discussed her case with her neurosurgeon, Dr. Annette Stable, and unfortunately there is not much she can do due to insurance issues.  She does not have any symptoms that are concerning for a spinal cord emergency and there is no indication to repeat her MRI.  She has good strength in her lower extremities including that she can dorsiflex and plantarflex her right foot.  He does recommend we can try a Medrol Dosepak and he will appeal her insurance denial.  Otherwise, from a stye perspective there is no evidence of cellulitis besides the stye itself.  We discussed supportive care and I will refer to ophthalmology given she has been using compresses for the last week or so.        Final  Clinical Impression(s) / ED Diagnoses Final diagnoses:  Right sided sciatica  Hordeolum externum of right lower eyelid    Rx / DC Orders ED Discharge Orders          Ordered    methylPREDNISolone (MEDROL DOSEPAK) 4 MG TBPK tablet        07/06/21 1050              Sherwood Gambler, MD 07/06/21 1103

## 2021-07-26 ENCOUNTER — Encounter (HOSPITAL_COMMUNITY): Payer: Self-pay | Admitting: Neurosurgery

## 2021-07-26 NOTE — Anesthesia Preprocedure Evaluation (Signed)
Anesthesia Evaluation  Patient identified by MRN, date of birth, ID band Patient awake    Reviewed: Allergy & Precautions, NPO status , Patient's Chart, lab work & pertinent test results  History of Anesthesia Complications (+) PONV and history of anesthetic complications  Airway Mallampati: II  TM Distance: >3 FB Neck ROM: Full    Dental no notable dental hx.    Pulmonary asthma ,    Pulmonary exam normal        Cardiovascular hypertension, Pt. on medications and Pt. on home beta blockers  Rhythm:Regular Rate:Normal     Neuro/Psych  Headaches, negative psych ROS   GI/Hepatic Neg liver ROS, GERD  Medicated,  Endo/Other  negative endocrine ROS  Renal/GU negative Renal ROS  negative genitourinary   Musculoskeletal  (+) Arthritis  (lumbar stenosis ), Osteoarthritis,  Fibromyalgia -  Abdominal Normal abdominal exam  (+)   Peds  Hematology negative hematology ROS (+)   Anesthesia Other Findings   Reproductive/Obstetrics                           Anesthesia Physical Anesthesia Plan  ASA: 2  Anesthesia Plan: General   Post-op Pain Management:    Induction: Intravenous  PONV Risk Score and Plan: 4 or greater and Ondansetron, Dexamethasone, Midazolam, Treatment may vary due to age or medical condition, Scopolamine patch - Pre-op and Amisulpride  Airway Management Planned: Mask and Oral ETT  Additional Equipment: None  Intra-op Plan:   Post-operative Plan: Extubation in OR  Informed Consent: I have reviewed the patients History and Physical, chart, labs and discussed the procedure including the risks, benefits and alternatives for the proposed anesthesia with the patient or authorized representative who has indicated his/her understanding and acceptance.     Dental advisory given  Plan Discussed with: CRNA  Anesthesia Plan Comments: (PAT note written 07/19/2021 by Myra Gianotti,  PA-C. Surgery postponed to 07/27/21 while awaiting insurance approval.   Lab Results      Component                Value               Date                      WBC                      4.0                 07/27/2021                HGB                      13.1                07/27/2021                HCT                      39.2                07/27/2021                MCV                      91.0  07/27/2021                PLT                      200                 07/27/2021           Lab Results      Component                Value               Date                      NA                       140                 07/27/2021                K                        2.8 (L)             07/27/2021                CO2                      20 (L)              07/27/2021                GLUCOSE                  97                  07/27/2021                BUN                      10                  07/27/2021                CREATININE               0.67                07/27/2021                CALCIUM                  9.3                 07/27/2021                GFRNONAA                 >60                 07/27/2021          )      Anesthesia Quick Evaluation

## 2021-07-26 NOTE — Progress Notes (Signed)
PCP - Dr Thereasa Distance Cardiologist - Dr Isaias Cowman Neurology - Dr Maudry Diego Shah/Poole  Chest x-ray - n/a EKG - 06/18/21 Stress Test - n/a ECHO - 12/11/19 [facility] Cardiac Cath - n/a  ICD Pacemaker/Loop - n/a  Sleep Study -  n/a CPAP - none  ERAS: Clear liquids til 11 AM DOS  Anesthesia review: Yes  STOP now taking any Aspirin (unless otherwise instructed by your surgeon), Aleve, Naproxen, Ibuprofen, Motrin, Advil, Goody's, BC's, all herbal medications, fish oil, and all vitamins.   Coronavirus Screening Do you have any of the following symptoms:  Cough yes/no: No Fever (>100.63F)  yes/no: No Runny nose yes/no: No Sore throat yes/no: No Difficulty breathing/shortness of breath  yes/no: No  Have you traveled in the last 14 days and where? yes/no: No  Patient verbalized understanding of instructions that were given via phone.

## 2021-07-27 ENCOUNTER — Ambulatory Visit (HOSPITAL_COMMUNITY): Payer: BC Managed Care – PPO | Admitting: Emergency Medicine

## 2021-07-27 ENCOUNTER — Other Ambulatory Visit: Payer: Self-pay

## 2021-07-27 ENCOUNTER — Ambulatory Visit (HOSPITAL_COMMUNITY): Payer: BC Managed Care – PPO

## 2021-07-27 ENCOUNTER — Observation Stay (HOSPITAL_COMMUNITY)
Admission: RE | Admit: 2021-07-27 | Discharge: 2021-07-28 | Disposition: A | Discharge status: Home / Self Care | Payer: BC Managed Care – PPO | Attending: Neurosurgery | Admitting: Neurosurgery

## 2021-07-27 ENCOUNTER — Encounter (HOSPITAL_COMMUNITY)
Admission: RE | Disposition: A | Discharge status: Home / Self Care | Payer: Self-pay | Source: Home / Self Care | Attending: Neurosurgery

## 2021-07-27 ENCOUNTER — Encounter (HOSPITAL_COMMUNITY): Payer: Self-pay | Admitting: Neurosurgery

## 2021-07-27 DIAGNOSIS — M48061 Spinal stenosis, lumbar region without neurogenic claudication: Secondary | ICD-10-CM | POA: Diagnosis present

## 2021-07-27 DIAGNOSIS — Z79899 Other long term (current) drug therapy: Secondary | ICD-10-CM | POA: Diagnosis not present

## 2021-07-27 DIAGNOSIS — Z8541 Personal history of malignant neoplasm of cervix uteri: Secondary | ICD-10-CM | POA: Diagnosis not present

## 2021-07-27 DIAGNOSIS — I10 Essential (primary) hypertension: Secondary | ICD-10-CM | POA: Insufficient documentation

## 2021-07-27 DIAGNOSIS — J45909 Unspecified asthma, uncomplicated: Secondary | ICD-10-CM | POA: Diagnosis not present

## 2021-07-27 DIAGNOSIS — M5136 Other intervertebral disc degeneration, lumbar region: Secondary | ICD-10-CM | POA: Diagnosis present

## 2021-07-27 DIAGNOSIS — M5116 Intervertebral disc disorders with radiculopathy, lumbar region: Secondary | ICD-10-CM | POA: Insufficient documentation

## 2021-07-27 LAB — CBC
HCT: 39.2 % (ref 36.0–46.0)
Hemoglobin: 13.1 g/dL (ref 12.0–15.0)
MCH: 30.4 pg (ref 26.0–34.0)
MCHC: 33.4 g/dL (ref 30.0–36.0)
MCV: 91 fL (ref 80.0–100.0)
Platelets: 200 10*3/uL (ref 150–400)
RBC: 4.31 MIL/uL (ref 3.87–5.11)
RDW: 12.8 % (ref 11.5–15.5)
WBC: 4 10*3/uL (ref 4.0–10.5)
nRBC: 0 % (ref 0.0–0.2)

## 2021-07-27 LAB — SURGICAL PCR SCREEN
MRSA, PCR: NEGATIVE
Staphylococcus aureus: NEGATIVE

## 2021-07-27 LAB — BASIC METABOLIC PANEL
Anion gap: 10 (ref 5–15)
BUN: 10 mg/dL (ref 6–20)
CO2: 20 mmol/L — ABNORMAL LOW (ref 22–32)
Calcium: 9.3 mg/dL (ref 8.9–10.3)
Chloride: 110 mmol/L (ref 98–111)
Creatinine, Ser: 0.67 mg/dL (ref 0.44–1.00)
GFR, Estimated: >60 mL/min (ref >60)
Glucose, Bld: 97 mg/dL (ref 70–99)
Potassium: 2.8 mmol/L — ABNORMAL LOW (ref 3.5–5.1)
Sodium: 140 mmol/L (ref 135–145)

## 2021-07-27 LAB — TYPE AND SCREEN
ABO/RH(D): A POS
Antibody Screen: NEGATIVE

## 2021-07-27 SURGERY — POSTERIOR LUMBAR FUSION 1 LEVEL
Anesthesia: General | Site: Spine Lumbar

## 2021-07-27 MED ORDER — PHENYLEPHRINE HCL-NACL 20-0.9 MG/250ML-% IV SOLN
INTRAVENOUS | Status: DC | PRN
  Administered 2021-07-27: 25 ug/min via INTRAVENOUS

## 2021-07-27 MED ORDER — PROPOFOL 10 MG/ML IV BOLUS
INTRAVENOUS | Status: DC | PRN
  Administered 2021-07-27: 150 mg via INTRAVENOUS

## 2021-07-27 MED ORDER — VITAMIN D (ERGOCALCIFEROL) 1.25 MG (50000 UNIT) PO CAPS: 50000.0000 [IU] | ORAL_CAPSULE | ORAL | Status: DC

## 2021-07-27 MED ORDER — FENTANYL CITRATE (PF) 100 MCG/2ML IJ SOLN
INTRAMUSCULAR | Status: AC
  Filled 2021-07-27: qty 2

## 2021-07-27 MED ORDER — VITAMIN B-12 1000 MCG PO TABS
5000.0000 ug | ORAL_TABLET | Freq: Two times a day (BID) | ORAL | Status: DC
  Administered 2021-07-27: 5000 ug via ORAL
  Filled 2021-07-27: qty 5

## 2021-07-27 MED ORDER — 0.9 % SODIUM CHLORIDE (POUR BTL) OPTIME
TOPICAL | Status: DC | PRN
  Administered 2021-07-27: 1000 mL

## 2021-07-27 MED ORDER — ORAL CARE MOUTH RINSE: 15.0000 mL | Freq: Once | OROMUCOSAL | Status: AC

## 2021-07-27 MED ORDER — OXYCODONE HCL 5 MG/5ML PO SOLN
ORAL | Status: AC
  Filled 2021-07-27: qty 5

## 2021-07-27 MED ORDER — FLEET ENEMA 7-19 GM/118ML RE ENEM: 1.0000 | ENEMA | Freq: Once | RECTAL | Status: DC | PRN

## 2021-07-27 MED ORDER — LISINOPRIL 10 MG PO TABS
10.0000 mg | ORAL_TABLET | Freq: Every day | ORAL | Status: DC
Start: 2021-07-27 — End: 2021-07-28
  Administered 2021-07-27: 10 mg via ORAL
  Filled 2021-07-27: qty 1

## 2021-07-27 MED ORDER — LIDOCAINE 2% (20 MG/ML) 5 ML SYRINGE
INTRAMUSCULAR | Status: DC | PRN
  Administered 2021-07-27: 60 mg via INTRAVENOUS

## 2021-07-27 MED ORDER — SCOPOLAMINE 1 MG/3DAYS TD PT72
1.0000 | MEDICATED_PATCH | TRANSDERMAL | Status: DC
Start: 2021-07-27 — End: 2021-07-27
  Filled 2021-07-27: qty 1

## 2021-07-27 MED ORDER — ONDANSETRON HCL 4 MG/2ML IJ SOLN
INTRAMUSCULAR | Status: AC
  Filled 2021-07-27: qty 2

## 2021-07-27 MED ORDER — ONDANSETRON HCL 4 MG/2ML IJ SOLN
INTRAMUSCULAR | Status: DC | PRN
  Administered 2021-07-27: 4 mg via INTRAVENOUS

## 2021-07-27 MED ORDER — BUPIVACAINE HCL (PF) 0.25 % IJ SOLN
INTRAMUSCULAR | Status: DC | PRN
  Administered 2021-07-27: 30 mL

## 2021-07-27 MED ORDER — EPHEDRINE 5 MG/ML INJ
INTRAVENOUS | Status: AC
  Filled 2021-07-27: qty 5

## 2021-07-27 MED ORDER — LACTATED RINGERS IV SOLN: INTRAVENOUS | Status: DC

## 2021-07-27 MED ORDER — ROSUVASTATIN CALCIUM 5 MG PO TABS
5.0000 mg | ORAL_TABLET | Freq: Every day | ORAL | Status: DC
  Administered 2021-07-27 – 2021-07-28 (×2): 5 mg via ORAL
  Filled 2021-07-27 (×2): qty 1

## 2021-07-27 MED ORDER — PROPOFOL 10 MG/ML IV BOLUS
INTRAVENOUS | Status: AC
Start: 2021-07-27 — End: ?
  Filled 2021-07-27: qty 20

## 2021-07-27 MED ORDER — HYDROCODONE-ACETAMINOPHEN 10-325 MG PO TABS
1.0000 | ORAL_TABLET | ORAL | Status: DC | PRN
  Administered 2021-07-28 (×2): 1 via ORAL
  Filled 2021-07-27 (×2): qty 1

## 2021-07-27 MED ORDER — ACETAMINOPHEN 325 MG PO TABS: 650.0000 mg | ORAL_TABLET | ORAL | Status: DC | PRN

## 2021-07-27 MED ORDER — MIDAZOLAM HCL 2 MG/2ML IJ SOLN
INTRAMUSCULAR | Status: AC
  Filled 2021-07-27: qty 2

## 2021-07-27 MED ORDER — SODIUM CHLORIDE 0.9% FLUSH: 3.0000 mL | INTRAVENOUS | Status: DC | PRN

## 2021-07-27 MED ORDER — ACETAMINOPHEN 10 MG/ML IV SOLN
1000.0000 mg | Freq: Once | INTRAVENOUS | Status: DC | PRN
  Administered 2021-07-27: 1000 mg via INTRAVENOUS

## 2021-07-27 MED ORDER — VANCOMYCIN HCL 1000 MG IV SOLR
INTRAVENOUS | Status: DC | PRN
  Administered 2021-07-27: 1000 mg via TOPICAL

## 2021-07-27 MED ORDER — POLYETHYLENE GLYCOL 3350 17 G PO PACK: 17.0000 g | PACK | Freq: Every day | ORAL | Status: DC | PRN

## 2021-07-27 MED ORDER — MOMETASONE FURO-FORMOTEROL FUM 200-5 MCG/ACT IN AERO
2.0000 | INHALATION_SPRAY | Freq: Two times a day (BID) | RESPIRATORY_TRACT | Status: DC
  Administered 2021-07-27: 2 via RESPIRATORY_TRACT
  Filled 2021-07-27: qty 8.8

## 2021-07-27 MED ORDER — PANTOPRAZOLE SODIUM 40 MG PO TBEC
40.0000 mg | DELAYED_RELEASE_TABLET | Freq: Every day | ORAL | Status: DC
  Administered 2021-07-28: 40 mg via ORAL
  Filled 2021-07-27: qty 1

## 2021-07-27 MED ORDER — LIDOCAINE 2% (20 MG/ML) 5 ML SYRINGE
INTRAMUSCULAR | Status: AC
  Filled 2021-07-27: qty 5

## 2021-07-27 MED ORDER — METOPROLOL SUCCINATE ER 25 MG PO TB24
12.5000 mg | ORAL_TABLET | Freq: Every day | ORAL | Status: DC
  Administered 2021-07-28: 12.5 mg via ORAL
  Filled 2021-07-27: qty 1

## 2021-07-27 MED ORDER — AMISULPRIDE (ANTIEMETIC) 5 MG/2ML IV SOLN: 10.0000 mg | Freq: Once | INTRAVENOUS | Status: DC | PRN

## 2021-07-27 MED ORDER — GABAPENTIN 600 MG PO TABS
600.0000 mg | ORAL_TABLET | Freq: Three times a day (TID) | ORAL | Status: DC
  Administered 2021-07-27 – 2021-07-28 (×2): 600 mg via ORAL
  Filled 2021-07-27 (×2): qty 1

## 2021-07-27 MED ORDER — FENTANYL CITRATE (PF) 100 MCG/2ML IJ SOLN
25.0000 ug | INTRAMUSCULAR | Status: DC | PRN
  Administered 2021-07-27 (×3): 50 ug via INTRAVENOUS

## 2021-07-27 MED ORDER — FENTANYL CITRATE (PF) 250 MCG/5ML IJ SOLN
INTRAMUSCULAR | Status: AC
  Filled 2021-07-27: qty 5

## 2021-07-27 MED ORDER — FENTANYL CITRATE (PF) 100 MCG/2ML IJ SOLN
50.0000 ug | Freq: Once | INTRAMUSCULAR | Status: AC
  Administered 2021-07-27: 50 ug via INTRAVENOUS

## 2021-07-27 MED ORDER — PHENYLEPHRINE 80 MCG/ML (10ML) SYRINGE FOR IV PUSH (FOR BLOOD PRESSURE SUPPORT)
PREFILLED_SYRINGE | INTRAVENOUS | Status: AC
  Filled 2021-07-27: qty 10

## 2021-07-27 MED ORDER — SODIUM CHLORIDE 0.9% FLUSH: 3.0000 mL | Freq: Two times a day (BID) | INTRAVENOUS | Status: DC

## 2021-07-27 MED ORDER — PHENYLEPHRINE 80 MCG/ML (10ML) SYRINGE FOR IV PUSH (FOR BLOOD PRESSURE SUPPORT)
PREFILLED_SYRINGE | INTRAVENOUS | Status: DC | PRN
  Administered 2021-07-27: 80 ug via INTRAVENOUS
  Administered 2021-07-27: 240 ug via INTRAVENOUS

## 2021-07-27 MED ORDER — ROCURONIUM BROMIDE 10 MG/ML (PF) SYRINGE
PREFILLED_SYRINGE | INTRAVENOUS | Status: DC | PRN
  Administered 2021-07-27: 50 mg via INTRAVENOUS
  Administered 2021-07-27: 20 mg via INTRAVENOUS

## 2021-07-27 MED ORDER — PHENYLEPHRINE HCL (PRESSORS) 10 MG/ML IV SOLN
INTRAVENOUS | Status: AC
  Filled 2021-07-27: qty 1

## 2021-07-27 MED ORDER — POTASSIUM CHLORIDE 10 MEQ/100ML IV SOLN
INTRAVENOUS | Status: AC
  Filled 2021-07-27: qty 200

## 2021-07-27 MED ORDER — DEXAMETHASONE SODIUM PHOSPHATE 10 MG/ML IJ SOLN
INTRAMUSCULAR | Status: DC | PRN
  Administered 2021-07-27: 10 mg via INTRAVENOUS

## 2021-07-27 MED ORDER — DIAZEPAM 5 MG PO TABS: 5.0000 mg | ORAL_TABLET | Freq: Four times a day (QID) | ORAL | Status: DC | PRN

## 2021-07-27 MED ORDER — BISACODYL 10 MG RE SUPP: 10.0000 mg | Freq: Every day | RECTAL | Status: DC | PRN

## 2021-07-27 MED ORDER — OXYCODONE HCL 5 MG PO TABS
10.0000 mg | ORAL_TABLET | ORAL | Status: DC | PRN
  Administered 2021-07-28 (×2): 10 mg via ORAL
  Filled 2021-07-27 (×2): qty 2

## 2021-07-27 MED ORDER — FENTANYL CITRATE (PF) 250 MCG/5ML IJ SOLN
INTRAMUSCULAR | Status: DC | PRN
  Administered 2021-07-27: 100 ug via INTRAVENOUS
  Administered 2021-07-27 (×3): 50 ug via INTRAVENOUS

## 2021-07-27 MED ORDER — CHLORHEXIDINE GLUCONATE 0.12 % MT SOLN
15.0000 mL | Freq: Once | OROMUCOSAL | Status: AC
  Administered 2021-07-27: 15 mL via OROMUCOSAL
  Filled 2021-07-27: qty 15

## 2021-07-27 MED ORDER — POTASSIUM 99 MG PO TABS: 99.0000 mg | ORAL_TABLET | Freq: Every day | ORAL | Status: DC

## 2021-07-27 MED ORDER — VANCOMYCIN HCL IN DEXTROSE 1-5 GM/200ML-% IV SOLN
1000.0000 mg | Freq: Once | INTRAVENOUS | Status: AC
  Administered 2021-07-27: 1000 mg via INTRAVENOUS
  Filled 2021-07-27: qty 200

## 2021-07-27 MED ORDER — TIZANIDINE HCL 4 MG PO TABS
4.0000 mg | ORAL_TABLET | Freq: Every day | ORAL | Status: DC
  Administered 2021-07-27: 4 mg via ORAL
  Filled 2021-07-27: qty 1

## 2021-07-27 MED ORDER — THROMBIN 20000 UNITS EX SOLR: CUTANEOUS | Status: DC | PRN

## 2021-07-27 MED ORDER — ROCURONIUM BROMIDE 10 MG/ML (PF) SYRINGE
PREFILLED_SYRINGE | INTRAVENOUS | Status: AC
Start: 2021-07-27 — End: ?
  Filled 2021-07-27: qty 20

## 2021-07-27 MED ORDER — THROMBIN 20000 UNITS EX SOLR
CUTANEOUS | Status: AC
  Filled 2021-07-27: qty 20000

## 2021-07-27 MED ORDER — VANCOMYCIN HCL IN DEXTROSE 1-5 GM/200ML-% IV SOLN
1000.0000 mg | Freq: Two times a day (BID) | INTRAVENOUS | Status: AC
  Administered 2021-07-27: 1000 mg via INTRAVENOUS
  Filled 2021-07-27: qty 200

## 2021-07-27 MED ORDER — FLUTICASONE PROPIONATE 50 MCG/ACT NA SUSP
1.0000 | Freq: Two times a day (BID) | NASAL | Status: DC
  Filled 2021-07-27: qty 16

## 2021-07-27 MED ORDER — PHENYLEPHRINE HCL (PRESSORS) 10 MG/ML IV SOLN
INTRAVENOUS | Status: AC
Start: 2021-07-27 — End: ?
  Filled 2021-07-27: qty 1

## 2021-07-27 MED ORDER — OXYCODONE HCL 5 MG/5ML PO SOLN
5.0000 mg | ORAL | Status: DC | PRN
  Administered 2021-07-27: 5 mg via ORAL

## 2021-07-27 MED ORDER — SODIUM CHLORIDE 0.9 % IV SOLN: 250.0000 mL | INTRAVENOUS | Status: DC

## 2021-07-27 MED ORDER — VANCOMYCIN HCL 1000 MG IV SOLR
INTRAVENOUS | Status: AC
  Filled 2021-07-27: qty 20

## 2021-07-27 MED ORDER — DEXMEDETOMIDINE HCL IN NACL 80 MCG/20ML IV SOLN
INTRAVENOUS | Status: AC
Start: 2021-07-27 — End: ?
  Filled 2021-07-27: qty 20

## 2021-07-27 MED ORDER — ONDANSETRON HCL 4 MG/2ML IJ SOLN: 4.0000 mg | Freq: Four times a day (QID) | INTRAMUSCULAR | Status: DC | PRN

## 2021-07-27 MED ORDER — BUPROPION HCL 75 MG PO TABS
75.0000 mg | ORAL_TABLET | Freq: Two times a day (BID) | ORAL | Status: DC
  Administered 2021-07-27 – 2021-07-28 (×2): 75 mg via ORAL
  Filled 2021-07-27 (×3): qty 1

## 2021-07-27 MED ORDER — MENTHOL 3 MG MT LOZG: 1.0000 | LOZENGE | OROMUCOSAL | Status: DC | PRN

## 2021-07-27 MED ORDER — ACETAMINOPHEN 650 MG RE SUPP: 650.0000 mg | RECTAL | Status: DC | PRN

## 2021-07-27 MED ORDER — DEXMEDETOMIDINE (PRECEDEX) IN NS 20 MCG/5ML (4 MCG/ML) IV SYRINGE
PREFILLED_SYRINGE | INTRAVENOUS | Status: DC | PRN
  Administered 2021-07-27: 8 ug via INTRAVENOUS
  Administered 2021-07-27: 12 ug via INTRAVENOUS

## 2021-07-27 MED ORDER — HYDROCHLOROTHIAZIDE 25 MG PO TABS
25.0000 mg | ORAL_TABLET | Freq: Every day | ORAL | Status: DC
  Administered 2021-07-27: 25 mg via ORAL
  Filled 2021-07-27: qty 1

## 2021-07-27 MED ORDER — MIDAZOLAM HCL 2 MG/2ML IJ SOLN
INTRAMUSCULAR | Status: DC | PRN
  Administered 2021-07-27: 2 mg via INTRAVENOUS

## 2021-07-27 MED ORDER — ALBUTEROL SULFATE (2.5 MG/3ML) 0.083% IN NEBU: 2.5000 mg | INHALATION_SOLUTION | RESPIRATORY_TRACT | Status: DC | PRN

## 2021-07-27 MED ORDER — HYDROMORPHONE HCL 1 MG/ML IJ SOLN
1.0000 mg | INTRAMUSCULAR | Status: DC | PRN
  Administered 2021-07-27: 1 mg via INTRAVENOUS
  Filled 2021-07-27: qty 1

## 2021-07-27 MED ORDER — ONDANSETRON HCL 4 MG PO TABS: 4.0000 mg | ORAL_TABLET | Freq: Four times a day (QID) | ORAL | Status: DC | PRN

## 2021-07-27 MED ORDER — GUAIFENESIN ER 600 MG PO TB12
600.0000 mg | ORAL_TABLET | Freq: Every day | ORAL | Status: DC
  Administered 2021-07-27 – 2021-07-28 (×2): 600 mg via ORAL
  Filled 2021-07-27 (×2): qty 1

## 2021-07-27 MED ORDER — PHENOL 1.4 % MT LIQD: 1.0000 | OROMUCOSAL | Status: DC | PRN

## 2021-07-27 MED ORDER — SUGAMMADEX SODIUM 200 MG/2ML IV SOLN
INTRAVENOUS | Status: DC | PRN
  Administered 2021-07-27: 150 mg via INTRAVENOUS

## 2021-07-27 MED ORDER — BUPIVACAINE HCL (PF) 0.25 % IJ SOLN
INTRAMUSCULAR | Status: AC
Start: 2021-07-27 — End: ?
  Filled 2021-07-27: qty 30

## 2021-07-27 MED ORDER — DEXAMETHASONE SODIUM PHOSPHATE 10 MG/ML IJ SOLN
INTRAMUSCULAR | Status: AC
Start: 2021-07-27 — End: ?
  Filled 2021-07-27: qty 1

## 2021-07-27 MED ORDER — FINASTERIDE 5 MG PO TABS
5.0000 mg | ORAL_TABLET | Freq: Every day | ORAL | Status: DC
  Administered 2021-07-27 – 2021-07-28 (×2): 5 mg via ORAL
  Filled 2021-07-27 (×2): qty 1

## 2021-07-27 MED ORDER — SCOPOLAMINE 1 MG/3DAYS TD PT72
MEDICATED_PATCH | TRANSDERMAL | Status: AC
  Administered 2021-07-27: 1.5 mg via TRANSDERMAL
  Filled 2021-07-27: qty 1

## 2021-07-27 MED ORDER — POTASSIUM CHLORIDE 10 MEQ/100ML IV SOLN
10.0000 meq | INTRAVENOUS | Status: AC
  Administered 2021-07-27 (×2): 10 meq via INTRAVENOUS
  Filled 2021-07-27: qty 100

## 2021-07-27 MED ORDER — PROPOFOL 10 MG/ML IV BOLUS
INTRAVENOUS | Status: AC
  Filled 2021-07-27: qty 20

## 2021-07-27 SURGICAL SUPPLY — 64 items
BAG COUNTER SPONGE SURGICOUNT (BAG) ×2 IMPLANT
BAG DECANTER FOR FLEXI CONT (MISCELLANEOUS) ×2 IMPLANT
BENZOIN TINCTURE PRP APPL 2/3 (GAUZE/BANDAGES/DRESSINGS) ×2 IMPLANT
BLADE BONE MILL MEDIUM (MISCELLANEOUS) ×2 IMPLANT
BLADE CLIPPER SURG (BLADE) IMPLANT
BUR CUTTER 7.0 ROUND (BURR) IMPLANT
BUR MATCHSTICK NEURO 3.0 LAGG (BURR) ×2 IMPLANT
CAGE EXP CATALYFT 9 (Plate) ×2 IMPLANT
CANISTER SUCT 3000ML PPV (MISCELLANEOUS) ×2 IMPLANT
CAP LCK SPNE (Orthopedic Implant) ×6 IMPLANT
CAP LOCK SPINE RADIUS (Orthopedic Implant) IMPLANT
CAP LOCKING (Orthopedic Implant) ×6 IMPLANT
CARTRIDGE OIL MAESTRO DRILL (MISCELLANEOUS) ×1 IMPLANT
CNTNR URN SCR LID CUP LEK RST (MISCELLANEOUS) ×1 IMPLANT
CONT SPEC 4OZ STRL OR WHT (MISCELLANEOUS) ×1
COVER BACK TABLE 60X90IN (DRAPES) ×2 IMPLANT
DERMABOND ADVANCED (GAUZE/BANDAGES/DRESSINGS) ×1
DERMABOND ADVANCED .7 DNX12 (GAUZE/BANDAGES/DRESSINGS) ×1 IMPLANT
DIFFUSER DRILL AIR PNEUMATIC (MISCELLANEOUS) ×2 IMPLANT
DRAPE C-ARM 42X72 X-RAY (DRAPES) ×4 IMPLANT
DRAPE HALF SHEET 40X57 (DRAPES) IMPLANT
DRAPE LAPAROTOMY 100X72X124 (DRAPES) ×2 IMPLANT
DRAPE SURG 17X23 STRL (DRAPES) ×8 IMPLANT
DRSG OPSITE POSTOP 4X6 (GAUZE/BANDAGES/DRESSINGS) ×2 IMPLANT
DRSG OPSITE POSTOP 4X8 (GAUZE/BANDAGES/DRESSINGS) ×1 IMPLANT
DURAPREP 26ML APPLICATOR (WOUND CARE) ×2 IMPLANT
ELECT REM PT RETURN 9FT ADLT (ELECTROSURGICAL) ×2
ELECTRODE REM PT RTRN 9FT ADLT (ELECTROSURGICAL) ×1 IMPLANT
EVACUATOR 1/8 PVC DRAIN (DRAIN) IMPLANT
GAUZE 4X4 16PLY ~~LOC~~+RFID DBL (SPONGE) IMPLANT
GAUZE SPONGE 4X4 12PLY STRL (GAUZE/BANDAGES/DRESSINGS) IMPLANT
GLOVE BIO SURGEON STRL SZ 6.5 (GLOVE) ×2 IMPLANT
GLOVE BIOGEL PI IND STRL 6.5 (GLOVE) ×1 IMPLANT
GLOVE BIOGEL PI INDICATOR 6.5 (GLOVE) ×1
GLOVE ECLIPSE 9.0 STRL (GLOVE) ×4 IMPLANT
GLOVE EXAM NITRILE XL STR (GLOVE) IMPLANT
GOWN STRL REUS W/ TWL LRG LVL3 (GOWN DISPOSABLE) IMPLANT
GOWN STRL REUS W/ TWL XL LVL3 (GOWN DISPOSABLE) ×2 IMPLANT
GOWN STRL REUS W/TWL 2XL LVL3 (GOWN DISPOSABLE) IMPLANT
GOWN STRL REUS W/TWL LRG LVL3 (GOWN DISPOSABLE)
GOWN STRL REUS W/TWL XL LVL3 (GOWN DISPOSABLE) ×2
KIT BASIN OR (CUSTOM PROCEDURE TRAY) ×2 IMPLANT
KIT TURNOVER KIT B (KITS) ×2 IMPLANT
MILL MEDIUM DISP (BLADE) ×2 IMPLANT
NEEDLE HYPO 22GX1.5 SAFETY (NEEDLE) ×2 IMPLANT
NS IRRIG 1000ML POUR BTL (IV SOLUTION) ×2 IMPLANT
OIL CARTRIDGE MAESTRO DRILL (MISCELLANEOUS) ×2
PACK LAMINECTOMY NEURO (CUSTOM PROCEDURE TRAY) ×2 IMPLANT
PUTTY DBF 6CC CORTICAL FIBERS (Putty) ×1 IMPLANT
ROD 5.5X60MM GREEN (Rod) ×1 IMPLANT
ROD 70MM (Rod) ×1 IMPLANT
ROD SPNL 70X5.5XNS TI RDS (Rod) IMPLANT
SCREW 5.75X45MM (Screw) ×2 IMPLANT
SPIKE FLUID TRANSFER (MISCELLANEOUS) ×2 IMPLANT
SPONGE SURGIFOAM ABS GEL 100 (HEMOSTASIS) ×2 IMPLANT
STRIP CLOSURE SKIN 1/2X4 (GAUZE/BANDAGES/DRESSINGS) ×3 IMPLANT
SUT VIC AB 0 CT1 18XCR BRD8 (SUTURE) ×2 IMPLANT
SUT VIC AB 0 CT1 8-18 (SUTURE) ×2
SUT VIC AB 2-0 CT1 18 (SUTURE) ×2 IMPLANT
SUT VIC AB 3-0 SH 8-18 (SUTURE) ×4 IMPLANT
TOWEL GREEN STERILE (TOWEL DISPOSABLE) ×2 IMPLANT
TOWEL GREEN STERILE FF (TOWEL DISPOSABLE) ×2 IMPLANT
TRAY FOLEY MTR SLVR 16FR STAT (SET/KITS/TRAYS/PACK) ×2 IMPLANT
WATER STERILE IRR 1000ML POUR (IV SOLUTION) ×2 IMPLANT

## 2021-07-27 NOTE — Op Note (Signed)
Date of procedure: 07/27/2021  Date of dictation: Same  Service: Neurosurgery  Preoperative diagnosis: Adjacent segment degeneration with disc herniation above prior lumbar fusion  Postoperative diagnosis: Same  Procedure Name: Bilateral L4-5 decompressive laminotomies and foraminotomies, more than would be required for simple interbody fusion alone.  L4-5 posterior lumbar interbody fusion utilizing interbody cage, local harvested autograft, and morselized allograft  L4-5 posterior lateral arthrodesis utilizing nonsegmental pedicle screw fixation and local autografting.  Surgeon:Wyoma Genson A.Iyanah Demont, M.D.  Asst. Surgeon: Kathyrn Sheriff, MD  Anesthesia: General  Indication: 51 year old female remotely status post L5-S1 decompression and fusion presents with severe back and right lower extremity pain failing conservative management.  Work-up demonstrates evidence of progressive disc degeneration with a rightward L4-5 disc herniation with an inferiorly migrated fragment causing marked compression of thecal sac and right L5 nerve root.  Patient presents now for decompression and fusion surgery in hopes of improving her symptoms.  Operative note: After induction of anesthesia, patient position prone onto Wilson frame and properly padded.  Lumbar region prepped and draped sterilely.  Incision made from L4-S1.  Dissection performed bilaterally.  Retractor placed.  Fluoroscopy used.  Levels confirmed.  Previously placed pedicle screws instrumentation at L5-S1 was dissected free further.  Disassembled.  And the fusion at L5-S1 was inspected and confirmed to be solid.  Screws were left in place.  Attention then placed L4-5.  Bilateral decompressive laminotomies and facetectomies were then performed using Leksell rongeurs and Kerrison rongeurs to remove the inferior two thirds of the lamina of L4 the entire inferior facet of L4 bilaterally, the superior facet of L5 was removed bilaterally and the superior aspect the  L5 lamina was removed.  Bilateral discectomy was then performed including removal of the inferior fragment on the right at L4-5.  The space was then prepared for interbody fusion.  With distractor placed the patient's right side to space was cleaned of soft tissue.  A 9 mm [employer] expandable cage was then impacted into place and expanded.  Distractor removed patient's right side.  Disc base prepared on the right side.  Soft tissue removed in the interspace.  Morselized autograft was then packed in the interspace.  A second cage was then impacted in the place and expanded.  Pedicles of L4 were identified bilaterally using intraoperative fluoroscopy and surface landmarks.  Pilot holes were drilled overlying the pedicle.  Each pedicle was then probed using a pedicle all each pedicle all track was then probed and found to be solidly within the bone.  Each pilot hole was then tapped with a screw tap.  Screw hole was probed and found to be solidly within the bone.  5.75 mm radius brand screws from [employer] placed bilaterally and L4.  Final images reveal good position of the cages and the hardware at the proper operative level with normal alignment of the spine.  Transverse processes of L4 and L5 were decorticated.  Morselized autograft was packed posterior laterally.  Each cage was packed with demineralized bone matrix.  Gelfoam was placed over the laminotomy defects.  Short segment titanium rod placed over the screw heads at L4-L5 and S1.  Locking caps were placed over the screws.  Locking caps were then engaged with a construct under mild compression.  Vancomycin powder was placed in deep wound space.  Wound is then closed in layers with Vicryl sutures.  Steri-Strips and sterile dressing were applied.  No apparent complications.  Patient tolerated the procedure well and she returns to recovery room postop.

## 2021-07-27 NOTE — Brief Op Note (Signed)
07/27/2021  5:22 PM  PATIENT:  Phyllis Park  51 y.o. female  PRE-OPERATIVE DIAGNOSIS:  Stenosis  POST-OPERATIVE DIAGNOSIS:  Stenosis  PROCEDURE:  Procedure(s): LUMBAR FOUR-FIVE POSTERIOR LUMBAR INTERBODY FUSION (N/A)  SURGEON:  Surgeon(s) and Role:    * Earnie Larsson, MD - Primary    * Consuella Lose, MD - Assisting  PHYSICIAN ASSISTANT:   ASSISTANTS:    ANESTHESIA:   general  EBL:  100 mL   BLOOD ADMINISTERED:none  DRAINS: none   LOCAL MEDICATIONS USED:  MARCAINE     SPECIMEN:  No Specimen  DISPOSITION OF SPECIMEN:  N/A  COUNTS:  YES  TOURNIQUET:  * No tourniquets in log *  DICTATION: .Dragon Dictation  PLAN OF CARE: Admit for overnight observation  PATIENT DISPOSITION:  PACU - hemodynamically stable.   Delay start of Pharmacological VTE agent (>24hrs) due to surgical blood loss or risk of bleeding: yes

## 2021-07-27 NOTE — Anesthesia Procedure Notes (Signed)
Procedure Name: Intubation Date/Time: 07/27/2021 3:13 PM  Performed by: Carolan Clines, CRNAPre-anesthesia Checklist: Patient identified, Emergency Drugs available, Suction available and Patient being monitored Patient Re-evaluated:Patient Re-evaluated prior to induction Oxygen Delivery Method: Circle System Utilized Preoxygenation: Pre-oxygenation with 100% oxygen Induction Type: IV induction Ventilation: Mask ventilation without difficulty Laryngoscope Size: 3 and Mac Grade View: Grade II Tube type: Oral Tube size: 7.0 mm Number of attempts: 1 Airway Equipment and Method: Stylet and Oral airway Placement Confirmation: ETT inserted through vocal cords under direct vision, positive ETCO2 and breath sounds checked- equal and bilateral Secured at: 21 cm Tube secured with: Tape Dental Injury: Teeth and Oropharynx as per pre-operative assessment  Comments: Placed by Orland Mustard SRNA

## 2021-07-27 NOTE — Progress Notes (Signed)
Pharmacy Antibiotic Note  Phyllis Park is a 51 y.o. female admitted on 07/27/2021 for spinal surgery.  Pharmacy has been consulted for Vancomycin dosing x 1 dose for post-op surgical prophylaxis.  Pre-op vanc given ~1300  Plan: Vancomycin 1gm IV q12h x 1 dose Pharmacy will sign off - please reconsult if needed  Height: '5\' 1"'$  (154.9 cm) Weight: 67.1 kg (148 lb) IBW/kg (Calculated) : 47.8  Temp (24hrs), Avg:98.7 F (37.1 C), Min:98.6 F (37 C), Max:98.9 F (37.2 C)  Recent Labs  Lab 07/27/21 1304  WBC 4.0  CREATININE 0.67    Estimated Creatinine Clearance: 74.5 mL/min (by C-G formula based on SCr of 0.67 mg/dL).    Allergies  Allergen Reactions   Penicillins     Has patient had a PCN reaction causing immediate rash, facial/tongue/throat swelling, SOB or lightheadedness with hypotension:Yes Has patient had a PCN reaction causing severe rash involving mucus membranes or skin necrosis:Yes Has patient had a PCN reaction that required hospitalization: No Has patient had a PCN reaction occurring within the last 10 years: No If all of the above answers are "NO", then may proceed with Cephalosporin use.    Aspirin Nausea Only and Other (See Comments)    Upset stomach   Erythromycin Nausea And Vomiting   Morphine And Related Other (See Comments)    Oversedated "one tablet last for 1 week"   Naproxen Nausea Only and Other (See Comments)    Thank you for allowing pharmacy to be a part of this patient's care.  Sherlon Handing, PharmD, BCPS Please see amion for complete clinical pharmacist phone list 07/27/2021 8:16 PM

## 2021-07-27 NOTE — Transfer of Care (Signed)
Immediate Anesthesia Transfer of Care Note  Patient: Phyllis Park  Procedure(s) Performed: LUMBAR FOUR-FIVE POSTERIOR LUMBAR INTERBODY FUSION (Spine Lumbar)  Patient Location: PACU  Anesthesia Type:General  Level of Consciousness: awake and oriented  Airway & Oxygen Therapy: Patient Spontanous Breathing and Patient connected to nasal cannula oxygen  Post-op Assessment: Report given to RN and Patient moving all extremities X 4  Post vital signs: Reviewed and stable  Last Vitals:  Vitals Value Taken Time  BP 133/72 07/27/21 1734  Temp    Pulse 86 07/27/21 1737  Resp 13 07/27/21 1737  SpO2 95 % 07/27/21 1737  Vitals shown include unvalidated device data.  Last Pain:  Vitals:   07/27/21 1223  TempSrc:   PainSc: 5          Complications: No notable events documented.

## 2021-07-27 NOTE — Anesthesia Postprocedure Evaluation (Signed)
Anesthesia Post Note  Patient: Phyllis Park  Procedure(s) Performed: LUMBAR FOUR-FIVE POSTERIOR LUMBAR INTERBODY FUSION (Spine Lumbar)     Patient location during evaluation: PACU Anesthesia Type: General Level of consciousness: awake Pain management: pain level controlled Vital Signs Assessment: post-procedure vital signs reviewed and stable Respiratory status: spontaneous breathing, nonlabored ventilation, respiratory function stable and patient connected to nasal cannula oxygen Cardiovascular status: blood pressure returned to baseline and stable Postop Assessment: no apparent nausea or vomiting Anesthetic complications: no   No notable events documented.  Last Vitals:  Vitals:   07/27/21 1945 07/27/21 2004  BP: (!) 149/82 136/75  Pulse: 79 79  Resp: 16   Temp: 37.1 C 37.2 C  SpO2: 97% 99%    Last Pain:  Vitals:   07/27/21 2033  TempSrc:   PainSc: 9                  Avary Eichenberger P Lou Loewe

## 2021-07-27 NOTE — H&P (Signed)
Phyllis Park is an 51 y.o. female.   Chief Complaint: Back pain HPI: 51 year old female remotely status post L5-S1 decompression and fusion surgery presents with severe worsening back pain with radiation to her lower extremities right greater than left.  Patient has failed conservative management including injections.  She is miserable with ongoing pain.  Work-up demonstrates evidence of adjacent level degeneration with an adjacent level disc herniation within the caudally migrated fragment on the right at L4-5 with marked thecal sac and right L5 nerve root compression.  Patient presents now for decompression and fusion at L4-5 in hopes of improving her symptoms.  Past Medical History:  Diagnosis Date   Asthma    Cancer ([place])    cervical   Fibromyalgia    GERD (gastroesophageal reflux disease)    Headache    migraines   Heart murmur    never has caused any problems, no tx   Herniated lumbar intervertebral disc    Hypertension    PONV (postoperative nausea and vomiting)     Past Surgical History:  Procedure Laterality Date   ABDOMINAL HYSTERECTOMY     BACK SURGERY     BILATERAL CARPAL TUNNEL RELEASE     bladder tact     COLONOSCOPY     TUBAL LIGATION      Family History  Problem Relation Age of Onset   Breast cancer Maternal Aunt    Social History:  reports that she has never smoked. She has never used smokeless tobacco. She reports current alcohol use. She reports that she does not use drugs.  Allergies:  Allergies  Allergen Reactions   Penicillins     Has patient had a PCN reaction causing immediate rash, facial/tongue/throat swelling, SOB or lightheadedness with hypotension:Yes Has patient had a PCN reaction causing severe rash involving mucus membranes or skin necrosis:Yes Has patient had a PCN reaction that required hospitalization: No Has patient had a PCN reaction occurring within the last 10 years: No If all of the above answers are "NO", then may proceed  with Cephalosporin use.    Aspirin Nausea Only and Other (See Comments)    Upset stomach   Erythromycin Nausea And Vomiting   Morphine And Related Other (See Comments)    Oversedated "one tablet last for 1 week"   Naproxen Nausea Only and Other (See Comments)    Medications Prior to Admission  Medication Sig Dispense Refill   AIMOVIG 140 MG/ML SOAJ Inject 140 mg into the skin every 28 (twenty-eight) days.     albuterol (VENTOLIN HFA) 108 (90 Base) MCG/ACT inhaler Inhale 2 puffs into the lungs every 4 (four) hours as needed. For wheezing or shortness of breath.     budesonide-formoterol (SYMBICORT) 160-4.5 MCG/ACT inhaler Inhale 2 puffs into the lungs 2 (two) times daily.     buPROPion (WELLBUTRIN) 75 MG tablet Take 75 mg by mouth 2 (two) times daily.     Cyanocobalamin 5000 MCG TBDP Take 5,000 mcg by mouth in the morning and at bedtime.     diclofenac sodium (VOLTAREN) 1 % GEL Apply 2 g topically 4 (four) times daily as needed (pain).  0   esomeprazole (NEXIUM) 20 MG capsule Take 40 mg by mouth at bedtime.     finasteride (PROSCAR) 5 MG tablet Take 5 mg by mouth daily.     fluticasone (FLONASE) 50 MCG/ACT nasal spray Place 1 spray into both nostrils 2 (two) times daily.     gabapentin (NEURONTIN) 600 MG tablet Take 600 mg  by mouth 3 (three) times daily.     guaiFENesin (MUCINEX) 600 MG 12 hr tablet Take 600 mg by mouth daily.     hydrochlorothiazide (HYDRODIURIL) 25 MG tablet Take 25 mg by mouth daily.     ibuprofen (ADVIL) 800 MG tablet Take 800 mg by mouth 3 (three) times daily.     lisinopril (PRINIVIL,ZESTRIL) 10 MG tablet Take 10 mg by mouth at bedtime.      metoprolol succinate (TOPROL-XL) 25 MG 24 hr tablet Take 12.5 mg by mouth daily.     oxyCODONE-acetaminophen (PERCOCET) 10-325 MG tablet Take 1 tablet by mouth every 4 (four) hours as needed for pain.     Potassium 99 MG TABS Take 99 mg by mouth daily.     rosuvastatin (CRESTOR) 5 MG tablet Take 5 mg by mouth daily.     SF 5000  PLUS 1.1 % CREA dental cream Place 1 application onto teeth at bedtime.  3   tiZANidine (ZANAFLEX) 4 MG tablet Take 4 mg by mouth at bedtime.     Vitamin D, Ergocalciferol, (DRISDOL) 50000 units CAPS capsule Take 50,000 Units by mouth 2 (two) times a week.  3   vitamin E 180 MG (400 UNITS) capsule Take 400 Units by mouth in the morning and at bedtime.     buprenorphine (BUTRANS) 15 MCG/HR Place 1 patch onto the skin once a week. (Patient not taking: Reported on 07/21/2021)      Results for orders placed or performed during the hospital encounter of 07/27/21 (from the past 48 hour(s))  Type and screen [place]     Status: None   Collection Time: 07/27/21 12:50 PM  Result Value Ref Range   ABO/RH(D) A POS    Antibody Screen NEG    Sample Expiration      07/30/2021,2359 Performed at [facility], [place] [address] [place], [place] [address]   Basic metabolic panel per protocol     Status: Abnormal   Collection Time: 07/27/21  1:04 PM  Result Value Ref Range   Sodium 140 135 - 145 mmol/L   Potassium 2.8 (L) 3.5 - 5.1 mmol/L   Chloride 110 98 - 111 mmol/L   CO2 20 (L) 22 - 32 mmol/L   Glucose, Bld 97 70 - 99 mg/dL    Comment: Glucose reference range applies only to samples taken after fasting for at least 8 hours.   BUN 10 6 - 20 mg/dL   Creatinine, Ser 0.67 0.44 - 1.00 mg/dL   Calcium 9.3 8.9 - 10.3 mg/dL   GFR, Estimated >60 >60 mL/min    Comment: (NOTE) Calculated using the CKD-EPI Creatinine Equation (2021)    Anion gap 10 5 - 15    Comment: Performed at [facility] [address], [place], [place] [address]  CBC per protocol     Status: None   Collection Time: 07/27/21  1:04 PM  Result Value Ref Range   WBC 4.0 4.0 - 10.5 K/uL   RBC 4.31 3.87 - 5.11 MIL/uL   Hemoglobin 13.1 12.0 - 15.0 g/dL   HCT 39.2 36.0 - 46.0 %   MCV 91.0 80.0 - 100.0 fL   MCH 30.4 26.0 - 34.0 pg   MCHC 33.4 30.0 - 36.0 g/dL   RDW 12.8 11.5 - 15.5 %   Platelets 200 150  - 400 K/uL   nRBC 0.0 0.0 - 0.2 %    Comment: Performed at [facility],  [address], [place], [place] [address]   No results found.  Pertinent items noted in HPI and remainder of comprehensive ROS otherwise negative.  Blood pressure (!) 144/88, pulse 93, temperature 98.6 F (37 C), temperature source Oral, resp. rate 18, height '5\' 1"'$  (1.549 m), weight 67.1 kg, SpO2 98 %.  Patient is awake and alert.  She is oriented and appropriate.  Speech is fluent.  Judgment insight are intact.  Cranial nerve function normal bilaterally motor examination reveals some slight weakness of dorsiflexion on the right side otherwise motor strength intact.  Sensory examination with decrease sensation pinprick light touch in her right L5 dermatome.  Deep Temrex is normal active.  No evidence of long track signs.  Gait antalgic.  Posture is normal peer examination head ears eyes nose and throat is unremarked.  Chest and abdomen are benign.  Extremities are free of major deformity. Assessment/Plan L4-5 herniated nucleus pulposus with severe radiculopathy adjacent to prior L5-S1 fusion.  Plan bilateral L4-5 decompressive laminotomies and foraminotomies followed by posterior lumbar interbody fusion with interbody cages, local harvested autograft, and allograft coupled with posterior lateral arthrodesis utilizing nonsegmental pedicle screw fixation and local autografting.  Risks and benefits been explained.  Patient wishes to proceed.  Cooper Render Gianina Olinde 07/27/2021, 2:32 PM

## 2021-07-28 DIAGNOSIS — M48061 Spinal stenosis, lumbar region without neurogenic claudication: Secondary | ICD-10-CM | POA: Diagnosis not present

## 2021-07-28 MED ORDER — OXYCODONE HCL 10 MG PO TABS: 10.0000 mg | ORAL_TABLET | ORAL | 0 refills | Status: AC | PRN

## 2021-07-28 NOTE — Discharge Instructions (Signed)

## 2021-07-28 NOTE — Progress Notes (Signed)
Patient transported to her vehicle via wheelchair by volunteer for discharge home; in no acute distress nor complaints of pain nor discomfort; moves all extremities well; incision on her lower back with honeycomb dressing and is clean, dry and intact with back brace on on; room was checked for all her belongings; discharge instructions concerning her medications, incision care, follow up appointment and when to call the doctor as needed were all discussed with patient and her husband by RN and both expressed understanding on the instructions given.

## 2021-07-28 NOTE — Discharge Summary (Signed)
Physician Discharge Summary  Patient ID: Phyllis Park MRN: [id number] DOB/AGE: February 03, 1971 51 y.o.  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  Postoperative doing very well.  Standing ambulating and voiding without difficulty.  Preoperative pain much improved.  Ready for discharge home.  Consults:   Significant Diagnostic Studies:   Treatments:   Discharge Exam: Blood pressure 107/71, pulse (!) 106, temperature 99.5 F (37.5 C), temperature source Oral, resp. rate 16, height '5\' 1"'$  (1.549 m), weight 67.1 kg, SpO2 99 %. Awake and alert.  Oriented and appropriate.  Motor and sensory function intact.  Wound clean dry and intact.  Chest and abdomen benign.  Disposition: Discharge disposition: 01-Home or Self Care        Allergies as of 07/28/2021       Reactions   Penicillins    Has patient had a PCN reaction causing immediate rash, facial/tongue/throat swelling, SOB or lightheadedness with hypotension:Yes Has patient had a PCN reaction causing severe rash involving mucus membranes or skin necrosis:Yes Has patient had a PCN reaction that required hospitalization: No Has patient had a PCN reaction occurring within the last 10 years: No If all of the above answers are "NO", then may proceed with Cephalosporin use.   Aspirin Nausea Only, Other (See Comments)   Upset stomach   Erythromycin Nausea And Vomiting   Morphine And Related Other (See Comments)   Oversedated "one tablet last for 1 week"   Naproxen Nausea Only, Other (See Comments)        Medication List     STOP taking these medications    oxyCODONE-acetaminophen 10-325 MG tablet Commonly known as: PERCOCET        TAKE these medications    Aimovig 140 MG/ML Soaj Generic drug: Erenumab-aooe Inject 140 mg into the skin every 28 (twenty-eight) days.   albuterol 108 (90 Base) MCG/ACT inhaler Commonly known as: VENTOLIN HFA Inhale 2 puffs into the lungs every 4 (four) hours as needed. For wheezing or shortness of breath.   budesonide-formoterol 160-4.5 MCG/ACT inhaler Commonly known as: SYMBICORT Inhale 2 puffs into the lungs 2 (two) times daily.   buprenorphine 15 MCG/HR Commonly known as: BUTRANS Place 1 patch onto the skin once a week.   buPROPion 75 MG tablet Commonly known as: WELLBUTRIN Take 75 mg by mouth 2 (two) times daily.   Cyanocobalamin 5000 MCG Tbdp Take 5,000 mcg by mouth in the morning and at bedtime.   diclofenac sodium 1 % Gel Commonly known as: VOLTAREN Apply 2 g topically 4 (four) times daily as needed (pain).   esomeprazole 20 MG capsule Commonly known as: NEXIUM Take 40 mg by mouth at bedtime.   finasteride 5 MG tablet Commonly known as: PROSCAR Take 5 mg by mouth daily.   fluticasone 50 MCG/ACT nasal spray Commonly known as: FLONASE Place 1 spray into both nostrils 2 (two) times daily.   gabapentin 600 MG tablet Commonly known as: NEURONTIN Take 600 mg by mouth 3 (three) times daily.   guaiFENesin 600 MG 12 hr tablet Commonly known as: MUCINEX Take 600 mg by mouth daily.   hydrochlorothiazide 25 MG tablet Commonly known as: HYDRODIURIL Take 25 mg by mouth daily.   ibuprofen 800 MG tablet Commonly known as: ADVIL Take 800  mg by mouth 3 (three) times daily.   lisinopril 10 MG tablet Commonly known as: ZESTRIL Take 10 mg by mouth at bedtime.   metoprolol succinate 25 MG 24 hr tablet Commonly known as: TOPROL-XL Take 12.5 mg by mouth daily.   Oxycodone HCl 10 MG Tabs Take 1 tablet (10 mg total) by mouth every 3 (three) hours as needed for severe pain ((score 7 to 10)).   Potassium 99 MG Tabs Take 99 mg by mouth daily.   rosuvastatin  5 MG tablet Commonly known as: CRESTOR Take 5 mg by mouth daily.   SF 5000 Plus 1.1 % Crea dental cream Generic drug: sodium fluoride Place 1 application onto teeth at bedtime.   tiZANidine 4 MG tablet Commonly known as: ZANAFLEX Take 4 mg by mouth at bedtime.   Vitamin D (Ergocalciferol) 1.25 MG (50000 UNIT) Caps capsule Commonly known as: DRISDOL Take 50,000 Units by mouth 2 (two) times a week.   vitamin E 180 MG (400 UNITS) capsule Take 400 Units by mouth in the morning and at bedtime.               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

## 2021-07-28 NOTE — Evaluation (Signed)
Physical Therapy Evaluation and Discharge Patient Details Name: Phyllis Park MRN: [id number] DOB: [birth year] Today's Date: 07/28/2021  History of Present Illness  Pt is a 51 y/o F s/p L4-5 PLIF on 6/20. PMH includes asthma, fibromyalgia, GERD, and HTN.  Clinical Impression  Patient evaluated by Physical Therapy with no further acute PT needs identified. All education has been completed and the patient has no further questions.  See below for any follow-up Physical Therapy or equipment needs. PT is signing off. Thank you for this referral.        Recommendations for follow up therapy are one component of a multi-disciplinary discharge planning process, led by the attending physician.  Recommendations may be updated based on patient status, additional functional criteria and insurance authorization.  Follow Up Recommendations No PT follow up      Assistance Recommended at Discharge Set up Supervision/Assistance  Patient can return home with the following  Help with stairs or ramp for entrance    Equipment Recommendations Rolling walker (2 wheels)  Recommendations for Other Services       Functional Status Assessment Patient has had a recent decline in their functional status and demonstrates the ability to make significant improvements in function in a reasonable and predictable amount of time.     Precautions / Restrictions Precautions Precautions: Back Precaution Booklet Issued: Yes (comment) Precaution Comments: pt recalled 2 of 3 precautions and 3rd with cues Required Braces or Orthoses: Spinal Brace Spinal Brace: Lumbar corset;Applied in sitting position Restrictions Weight Bearing Restrictions: No      Mobility  Bed Mobility               General bed mobility comments: sitting on EOB; plans to sleep in lift chair at home    Transfers Overall transfer level: Needs assistance Equipment used: Rolling walker (2 wheels) Transfers: Sit to/from Stand Sit  to Stand: Supervision           General transfer comment: vc for hand placement    Ambulation/Gait Ambulation/Gait assistance: Min guard Gait Distance (Feet): 200 Feet Assistive device: Rolling walker (2 wheels) Gait Pattern/deviations: Step-through pattern, Decreased stride length Gait velocity: decr Gait velocity interpretation: 1.31 - 2.62 ft/sec, indicative of limited community ambulator   General Gait Details: vc for proximity to RW and upright posture  Stairs Stairs: Yes Stairs assistance: Min assist Stair Management: One rail Right, Step to pattern, Forwards Number of Stairs: 3 General stair comments: vc for leading with stronger leg on ascent and weaker leg as descend  Wheelchair Mobility    Modified Rankin (Stroke Patients Only)       Balance Overall balance assessment: Mild deficits observed, not formally tested                                           Pertinent Vitals/Pain Pain Assessment Pain Assessment: 0-10 Pain Score: 6  Pain Location: low back Pain Descriptors / Indicators: Constant, Discomfort Pain Intervention(s): Limited activity within patient's tolerance, Monitored during session    Home Living Family/patient expects to be discharged to:: Private residence Living Arrangements: Spouse/significant other;Children Available Help at Discharge: Family;Available 24 hours/day Type of Home: House Home Access: Stairs to enter   Entrance Stairs-Number of Steps: 5 Alternate Level Stairs-Number of Steps: flight Home Layout: Two level;Bed/bath upstairs Home Equipment: Cane - single point;Crutches;Shower seat      Prior Function Prior Level  of Function : Independent/Modified Independent             Mobility Comments: crutches and cane use ADLs Comments: spouse assisted with shower transfer; IADLs     Hand Dominance        Extremity/Trunk Assessment   Upper Extremity Assessment Upper Extremity Assessment: Defer to OT  evaluation    Lower Extremity Assessment Lower Extremity Assessment: Generalized weakness    Cervical / Trunk Assessment Cervical / Trunk Assessment: Back Surgery  Communication   Communication: No difficulties  Cognition Arousal/Alertness: Awake/alert Behavior During Therapy: WFL for tasks assessed/performed Overall Cognitive Status: Within Functional Limits for tasks assessed                                          General Comments General comments (skin integrity, edema, etc.): VSS on RA    Exercises     Assessment/Plan    PT Assessment Patient does not need any further PT services  PT Problem List         PT Treatment Interventions      PT Goals (Current goals can be found in the Care Plan section)  Acute Rehab PT Goals Patient Stated Goal: return home today PT Goal Formulation: All assessment and education complete, DC therapy    Frequency       Co-evaluation               AM-PAC PT "6 Clicks" Mobility  Outcome Measure Help needed turning from your back to your side while in a flat bed without using bedrails?: A Little Help needed moving from lying on your back to sitting on the side of a flat bed without using bedrails?: A Little Help needed moving to and from a bed to a chair (including a wheelchair)?: A Little Help needed standing up from a chair using your arms (e.g., wheelchair or bedside chair)?: A Little Help needed to walk in hospital room?: A Little Help needed climbing 3-5 steps with a railing? : A Little 6 Click Score: 18    End of Session Equipment Utilized During Treatment: Gait belt;Back brace Activity Tolerance: Patient tolerated treatment well Patient left: in bed;with call bell/phone within reach (sitting EOB (pt preferred over recliner)) Nurse Communication: Mobility status;Other (comment) (RW needed) PT Visit Diagnosis: Other abnormalities of gait and mobility (R26.89)    Time: 6761-9509 PT Time Calculation  (min) (ACUTE ONLY): 10 min   Charges:   PT Evaluation $PT Eval Low Complexity: Teller, PT Acute Rehabilitation Services  Office [phone]   Rexanne Mano 07/28/2021, 9:00 AM

## 2021-07-28 NOTE — Plan of Care (Signed)

## 2021-07-28 NOTE — Evaluation (Signed)
Occupational Therapy Evaluation Patient Details Name: Phyllis Park MRN: [id number] DOB: [birth year] Today's Date: 07/28/2021   History of Present Illness Pt is a 51 y/o F s/p L4-5 PLIF on 6/20. PMH includes asthma, fibromyalgia, GERD, and HTN.   Clinical Impression   Pt requiring assistance with ADLs and has been using cane/crutches for functional mobility. Reports prior falls. Pt currently  needing supervision-mod A for ADLs, supervision for bed mobility and transfers with RW. Pt educated on compensatory strategies for ADLs, log rolling technique, precautions, and brace wear schedule. Pt verbalized understanding, adheres to precautions throughout session. Pt presenting with impairments listed below, will follow acutely. Recommend HHOT at d/c.      Recommendations for follow up therapy are one component of a multi-disciplinary discharge planning process, led by the attending physician.  Recommendations may be updated based on patient status, additional functional criteria and insurance authorization.   Follow Up Recommendations  Home health OT    Assistance Recommended at Discharge Intermittent Supervision/Assistance  Patient can return home with the following A little help with walking and/or transfers;A little help with bathing/dressing/bathroom;Assistance with cooking/housework;Assist for transportation;Help with stairs or ramp for entrance    Functional Status Assessment  Patient has had a recent decline in their functional status and demonstrates the ability to make significant improvements in function in a reasonable and predictable amount of time.  Equipment Recommendations  BSC/3in1    Recommendations for Other Services PT consult     Precautions / Restrictions Precautions Precautions: Cervical Precaution Booklet Issued: Yes (comment) Precaution Comments: reviewed 3/3 cervical precautions Required Braces or Orthoses: Spinal Brace Spinal Brace: Lumbar corset;Applied  in sitting position Restrictions Weight Bearing Restrictions: No      Mobility Bed Mobility Overal bed mobility: Needs Assistance Bed Mobility: Sidelying to Sit, Sit to Sidelying   Sidelying to sit: Supervision     Sit to sidelying: Supervision General bed mobility comments: log roll technique    Transfers Overall transfer level: Needs assistance Equipment used: Rolling walker (2 wheels) Transfers: Sit to/from Stand Sit to Stand: Supervision                  Balance Overall balance assessment: Mild deficits observed, not formally tested                                         ADL either performed or assessed with clinical judgement   ADL Overall ADL's : Needs assistance/impaired Eating/Feeding: Set up;Sitting   Grooming: Set up;Sitting   Upper Body Bathing: Minimal assistance;Sitting   Lower Body Bathing: Moderate assistance;Sitting/lateral leans   Upper Body Dressing : Supervision/safety;Sitting   Lower Body Dressing: Minimal assistance;Sitting/lateral leans   Toilet Transfer: Supervision/safety;Comfort height toilet;Ambulation;Rolling walker (2 wheels)   Toileting- Clothing Manipulation and Hygiene: Supervision/safety;Sitting/lateral lean       Functional mobility during ADLs: Supervision/safety;Rolling walker (2 wheels);Cueing for safety;Cueing for sequencing       Vision   Vision Assessment?: No apparent visual deficits     Perception     Praxis      Pertinent Vitals/Pain Pain Assessment Pain Assessment: Faces Pain Score: 4  Faces Pain Scale: Hurts little more Pain Location: low back Pain Descriptors / Indicators: Constant, Discomfort Pain Intervention(s): Limited activity within patient's tolerance, Monitored during session, Repositioned     Hand Dominance     Extremity/Trunk Assessment Upper Extremity Assessment Upper Extremity Assessment: Overall [facility]  for tasks assessed   Lower Extremity Assessment Lower  Extremity Assessment: Defer to PT evaluation   Cervical / Trunk Assessment Cervical / Trunk Assessment: Back Surgery   Communication Communication Communication: No difficulties   Cognition Arousal/Alertness: Awake/alert Behavior During Therapy: WFL for tasks assessed/performed Overall Cognitive Status: Within Functional Limits for tasks assessed                                       General Comments  VSS on RA    Exercises     Shoulder Instructions      Home Living Family/patient expects to be discharged to:: Private residence Living Arrangements: Spouse/significant other;Children Available Help at Discharge: Family;Available 24 hours/day Type of Home: House Home Access: Stairs to enter [employer] of Steps: 5   Home Layout: Two level;Bed/bath upstairs     Bathroom Shower/Tub: International aid/development worker Accessibility: Yes How Accessible: Accessible via walker Home Equipment: Cane - single point;Crutches;Shower seat          Prior Functioning/Environment Prior Level of Function : Independent/Modified Independent             Mobility Comments: crutches and cane use ADLs Comments: spouse assisted with shower transfer; IADLs        OT Problem List: Decreased range of motion;Decreased strength;Decreased activity tolerance;Impaired balance (sitting and/or standing)      OT Treatment/Interventions: Self-care/ADL training;Therapeutic exercise;Energy conservation;DME and/or AE instruction;Therapeutic activities;Patient/family education;Balance training    OT Goals(Current goals can be found in the care plan section) Acute Rehab OT Goals Patient Stated Goal: to get better OT Goal Formulation: With patient Time For Goal Achievement: 08/11/21 Potential to Achieve Goals: Good  OT Frequency: Min 2X/week    Co-evaluation              AM-PAC OT "6 Clicks" Daily Activity     Outcome Measure Help from another person eating  meals?: None Help from another person taking care of personal grooming?: None Help from another person toileting, which includes using toliet, bedpan, or urinal?: A Little Help from another person bathing (including washing, rinsing, drying)?: A Little Help from another person to put on and taking off regular upper body clothing?: None Help from another person to put on and taking off regular lower body clothing?: A Little 6 Click Score: 21   End of Session Equipment Utilized During Treatment: Rolling walker (2 wheels) Nurse Communication: Mobility status  Activity Tolerance: Patient tolerated treatment well Patient left: in bed;with call bell/phone within reach  OT Visit Diagnosis: Unsteadiness on feet (R26.81);Other abnormalities of gait and mobility (R26.89);Muscle weakness (generalized) (M62.81)                Time: 3016-0109 OT Time Calculation (min): 22 min Charges:  OT General Charges $OT Visit: 1 Visit OT Evaluation $OT Eval Low Complexity: 1 Low  Lynnda Child, OTD, OTR/L Acute Rehab [phone] - [place] 07/28/2021, 8:27 AM

## 2021-07-29 MED FILL — Sodium Chloride IV Soln 0.9%: INTRAVENOUS | Qty: 1000 | Status: AC

## 2021-07-29 MED FILL — Heparin Sodium (Porcine) Inj 1000 Unit/ML: INTRAMUSCULAR | Qty: 30 | Status: AC

## 2021-11-16 ENCOUNTER — Other Ambulatory Visit: Payer: Self-pay | Admitting: Certified Nurse Midwife

## 2021-11-16 DIAGNOSIS — Z1231 Encounter for screening mammogram for malignant neoplasm of breast: Secondary | ICD-10-CM

## 2021-12-09 ENCOUNTER — Ambulatory Visit
Admission: RE | Admit: 2021-12-09 | Discharge: 2021-12-09 | Disposition: A | Discharge status: Home / Self Care | Payer: BC Managed Care – PPO | Source: Ambulatory Visit | Attending: Certified Nurse Midwife | Admitting: Certified Nurse Midwife

## 2021-12-09 DIAGNOSIS — Z1231 Encounter for screening mammogram for malignant neoplasm of breast: Secondary | ICD-10-CM | POA: Insufficient documentation

## 2022-01-07 ENCOUNTER — Ambulatory Visit: Admit: 2022-01-07 | Payer: BC Managed Care – PPO | Admitting: Internal Medicine

## 2022-01-07 SURGERY — COLONOSCOPY WITH PROPOFOL
Anesthesia: General

## 2022-02-02 ENCOUNTER — Encounter: Payer: Self-pay | Admitting: Registered Nurse

## 2022-02-02 ENCOUNTER — Ambulatory Visit
Admission: RE | Admit: 2022-02-02 | Payer: BC Managed Care – PPO | Source: Home / Self Care | Admitting: Internal Medicine

## 2022-02-02 ENCOUNTER — Encounter: Admission: RE | Payer: Self-pay | Source: Home / Self Care

## 2022-02-02 SURGERY — COLONOSCOPY WITH PROPOFOL
Anesthesia: General

## 2022-10-28 ENCOUNTER — Ambulatory Visit
Admission: EM | Admit: 2022-10-28 | Discharge: 2022-10-28 | Disposition: A | Discharge status: Home / Self Care | Payer: BC Managed Care – PPO | Attending: Family Medicine | Admitting: Family Medicine

## 2022-10-28 DIAGNOSIS — H6992 Unspecified Eustachian tube disorder, left ear: Secondary | ICD-10-CM

## 2022-10-28 DIAGNOSIS — R0981 Nasal congestion: Secondary | ICD-10-CM

## 2022-10-28 DIAGNOSIS — H6993 Unspecified Eustachian tube disorder, bilateral: Secondary | ICD-10-CM

## 2022-10-28 MED ORDER — PREDNISONE 10 MG (21) PO TBPK: ORAL_TABLET | Freq: Every day | ORAL | 0 refills | Status: AC

## 2022-10-28 NOTE — Discharge Instructions (Addendum)
Stop by the pharmacy to pick up your prescription.    Call for a new patient appointment at  [facility] Address: [address], [place], [place] [address] Phone: [phone]

## 2022-10-28 NOTE — ED Provider Notes (Signed)
[facility]    CSN: [id number] Arrival date & time: 10/28/22  1114      History   Chief Complaint Chief Complaint  Patient presents with   Ear Pain   Facial Pain          HPI Phyllis Park is a 52 y.o. female.   HPI  History obtained from the patient. Phyllis Park presents for sinus pressure, headache and left greater than right ear pain that started 1 week ago.  Has history of eustachian tube dysfunction.  Reports that she does not follow with a ear nose and throat specialist.  She was given antibiotic that she has taken for 2 days and it hasn't helped her at all.  No rhinorrhea, nasal congestion, fever, cough, vomiting, new neck pain or sore throat.     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      Home Medications    Prior to Admission medications   Medication Sig Start Date End Date Taking? Authorizing Provider  AIMOVIG 140 MG/ML SOAJ Inject 140 mg into the skin every 28 (twenty-eight) days. 06/14/21  Yes [provider]  albuterol (VENTOLIN HFA) 108 (90 Base) MCG/ACT inhaler Inhale 2 puffs into the lungs every 4 (four) hours as needed. For wheezing or shortness of breath. 06/30/15  Yes [provider]  budesonide-formoterol  (SYMBICORT) 160-4.5 MCG/ACT inhaler Inhale 2 puffs into the lungs 2 (two) times daily.   Yes [provider]  buprenorphine (BUTRANS) 15 MCG/HR Place 1 patch onto the skin once a week. 08/20/19  Yes [provider]  buPROPion (WELLBUTRIN) 75 MG tablet Take 75 mg by mouth 2 (two) times daily.   Yes [provider]  Cyanocobalamin 5000 MCG TBDP Take 5,000 mcg by mouth in the morning and at bedtime.   Yes [provider]  diclofenac sodium (VOLTAREN) 1 % GEL Apply 2 g topically 4 (four) times daily as needed (pain). 04/10/17  Yes [provider]  esomeprazole (NEXIUM) 20 MG capsule Take 40 mg by mouth at bedtime.   Yes [provider]  finasteride (PROSCAR) 5 MG tablet Take 5 mg by mouth daily.   Yes [provider]  fluticasone (FLONASE) 50 MCG/ACT nasal spray Place 1 spray into both nostrils 2 (two) times daily.   Yes [provider]  gabapentin (NEURONTIN) 600 MG tablet Take 600 mg by mouth 3 (three) times daily.   Yes [provider]  guaiFENesin (MUCINEX) 600 MG 12 hr tablet Take 600 mg by mouth daily.   Yes [provider]  hydrochlorothiazide (HYDRODIURIL) 25 MG tablet Take 25 mg  by mouth daily.   Yes [provider]  ibuprofen (ADVIL) 800 MG tablet Take 800 mg by mouth 3 (three) times daily.   Yes [provider]  lisinopril (PRINIVIL,ZESTRIL) 10 MG tablet Take 10 mg by mouth at bedtime.    Yes [provider]  metoprolol succinate (TOPROL-XL) 25 MG 24 hr tablet Take 12.5 mg by mouth daily.   Yes [provider]  Potassium 99 MG TABS Take 99 mg by mouth daily.   Yes [provider]  predniSONE (STERAPRED UNI-PAK 21 TAB) 10 MG (21) TBPK tablet Take by mouth daily. Take 6 tabs by mouth daily for 1, then 5 tabs for 1 day, then 4 tabs for 1 day, then 3 tabs for 1 day, then 2 tabs for 1 day, then 1 tab for 1 day. 10/28/22  Yes Elnora Quizon, DO  rosuvastatin (CRESTOR) 5  MG tablet Take 5 mg by mouth daily.   Yes [provider]  SF 5000 PLUS 1.1 % CREA dental cream Place 1 application onto teeth at bedtime. 05/27/16  Yes [provider]  tiZANidine (ZANAFLEX) 4 MG tablet Take 4 mg by mouth at bedtime. 05/27/21  Yes [provider]  Vitamin D, Ergocalciferol, (DRISDOL) 50000 units CAPS capsule Take 50,000 Units by mouth 2 (two) times a week. 07/17/16  Yes [provider]  vitamin E 180 MG (400 UNITS) capsule Take 400 Units by mouth in the morning and at bedtime.   Yes [provider]  oxyCODONE 10 MG TABS Take 1 tablet (10 mg total) by mouth every 3 (three) hours as needed for severe pain ((score 7 to 10)). 07/28/21   Julio Sicks, MD    Family History Family History  Problem Relation Age of Onset   Breast cancer Maternal Aunt     Social History Social History   Tobacco Use   Smoking status: Never   Smokeless tobacco: Never  Vaping Use   Vaping status: Never Used  Substance Use Topics   Alcohol use: Yes    Comment: occassional   Drug use: No     Allergies   Penicillins, Aspirin, Erythromycin, Morphine and codeine, and Naproxen   Review of Systems Review of Systems: negative unless otherwise stated in HPI.      Physical Exam Triage Vital Signs ED Triage Vitals  Encounter Vitals Group     BP 10/28/22 1219 (!) 150/92     Systolic BP Percentile --      Diastolic BP Percentile --      Pulse Rate 10/28/22 1219 63     Resp --      Temp 10/28/22 1219 98.3 F (36.8 C)     Temp Source 10/28/22 1219 Oral     SpO2 10/28/22 1219 97 %     Weight 10/28/22 1217 140 lb (63.5 kg)     Height 10/28/22 1217 5\' 1"  (1.549 m)     Head Circumference --      Peak Flow --      Pain Score 10/28/22 1217 7     Pain Loc --      Pain Education --      Exclude from Growth Chart --    No data found.  Updated Vital Signs BP (!) 150/92 (BP Location: Left Arm)   Pulse 63   Temp 98.3 F (36.8 C) (Oral)   Ht 5\' 1"   (1.549 m)   Wt 63.5 kg   SpO2 97%   BMI 26.45 kg/m  Visual Acuity Right Eye Distance:   Left Eye Distance:   Bilateral Distance:    Right Eye Near:   Left Eye Near:    Bilateral Near:     Physical Exam GEN:     alert, non-toxic appearing female in no distress    HENT:  mucus membranes moist, moderate pale edematous turbinates, clear nasal discharge, bilateral TM effusion without erythema or bulging EYES:   no scleral injection or discharge NECK:  normal ROM, no meningismus   RESP:  no increased work of breathing Skin:   warm and dry    UC Treatments / Results  Labs (all labs ordered are listed, but only abnormal results are displayed) Labs Reviewed - No data to display  EKG   Radiology No results found.  Procedures Procedures (including critical care time)  Medications Ordered in UC Medications - No data to display  Initial Impression / Assessment and Plan / UC Course  I have reviewed the triage vital signs and the nursing notes.  Pertinent labs & imaging results that were available during my care of the patient were reviewed by me and considered in my medical decision making (see chart for details).       Pt is a 52 y.o. female with history of left eustachian tube dysfunction who presents for sinus pressure and ear pain for the past week.  On chart review, patient was seen for virtual visit and diagnosed with sinusitis.  She has been taking the prescribed doxycycline of which she is taken 4 tablets without relief of her symptoms.    [place] is afebrile here. Satting well on room air. Overall pt is non-toxic appearing, well hydrated, without respiratory distress.  Has bilateral TM effusions without erythema or bulging.  She has pale edematous nasal turbinates.  I suspect that she may have allergic rhinitis with eustachian tube dysfunction versus viral illness.  Explained to patient that antibiotics are not helping therefore likely not bacterial sinus infection.   Typical duration of symptoms discussed.  Prescribed prednisone.  ENT referral placed.  Patient given ENT contact information and will call and schedule her an appointment.  Return and ED precautions given and voiced understanding. Discussed MDM, treatment plan and plan for follow-up with patient who agrees with plan.     Final Clinical Impressions(s) / UC Diagnoses   Final diagnoses:  Eustachian tube dysfunction, bilateral  Nasal congestion     Discharge Instructions      Stop by the pharmacy to pick up your prescription.    Call for a new patient appointment at  [facility] Address: [address], [place], [place] [address] Phone: [phone]     ED Prescriptions     Medication Sig Dispense Auth. Provider   predniSONE (STERAPRED UNI-PAK 21 TAB) 10 MG (21) TBPK tablet Take by mouth daily. Take 6 tabs by mouth daily for 1, then 5 tabs for 1 day, then 4 tabs for 1 day, then 3 tabs for 1 day, then 2 tabs for 1 day, then 1 tab for 1 day. 21 tablet Katha Cabal, DO      PDMP not reviewed this encounter.   Katha Cabal, DO 10/28/22 1358

## 2022-10-28 NOTE — ED Triage Notes (Signed)
Pt c/o left ear pain x1week and headache, facial pain, ear pressure x1week  Pt was seen virtually and was given doxycycline but states that it has not been helping. Pt is on her 3rd day of doxycycline  Pt has tried OTC decongestants and they have not been helping.

## 2023-03-26 IMAGING — CR DG LUMBAR SPINE COMPLETE 4+V
5 series · 5 of 5 positions shown · non-contrast
Comparison: July 29, 2020.

CLINICAL DATA: Chronic lower back pain after fall.

EXAM:
LUMBAR SPINE - COMPLETE 4+ VIEW

[l-spine ap]
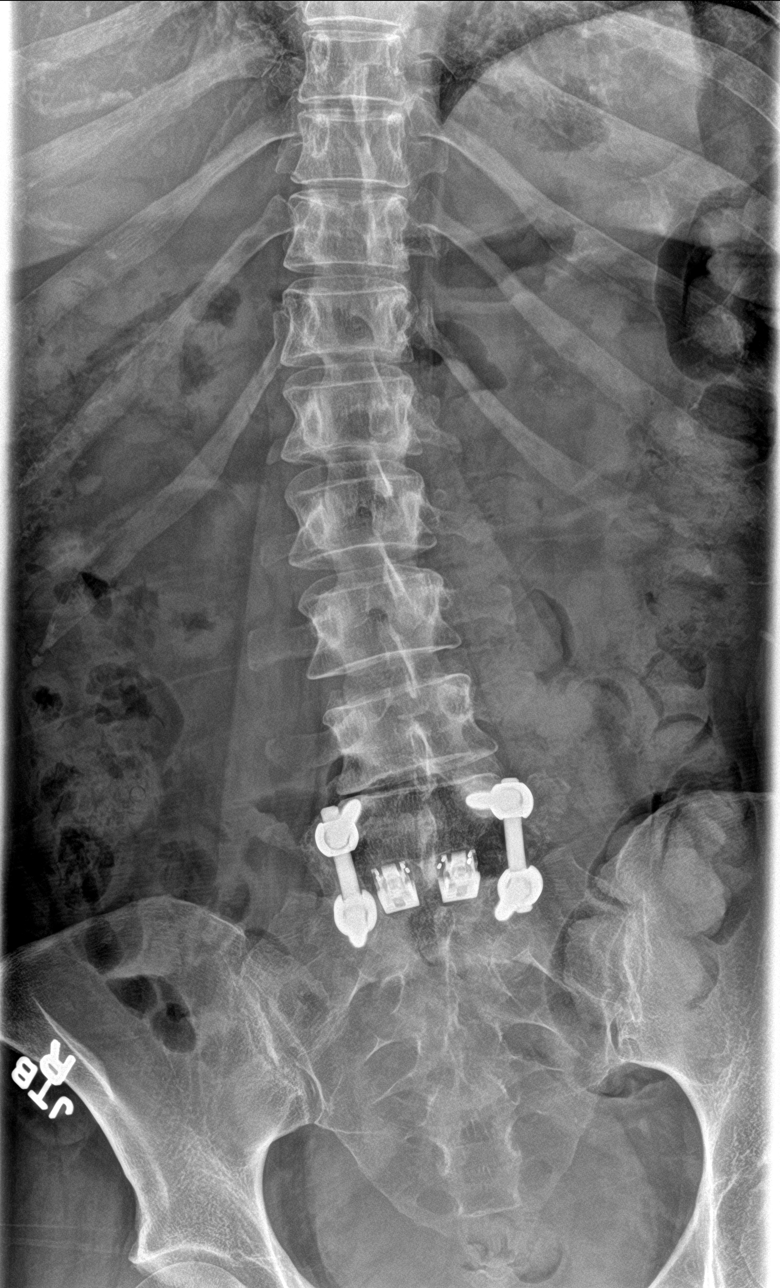

[l-spine obl (1 of 2)]
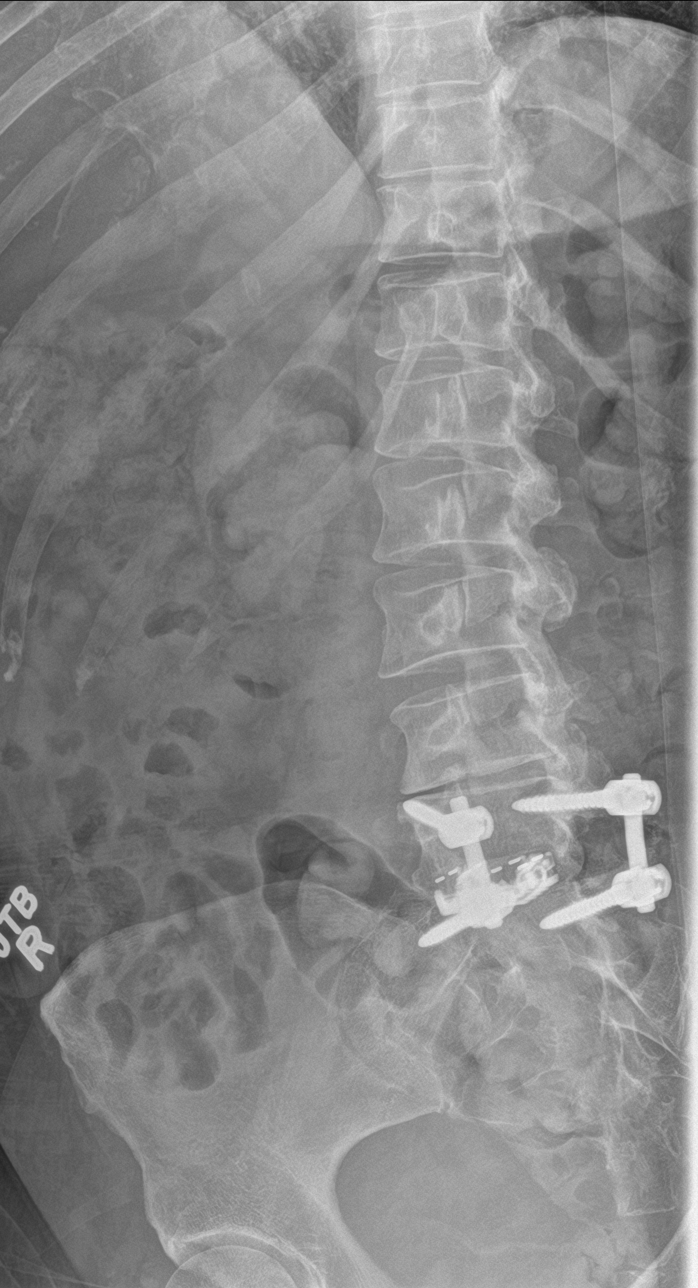

[l-spine obl (2 of 2)]
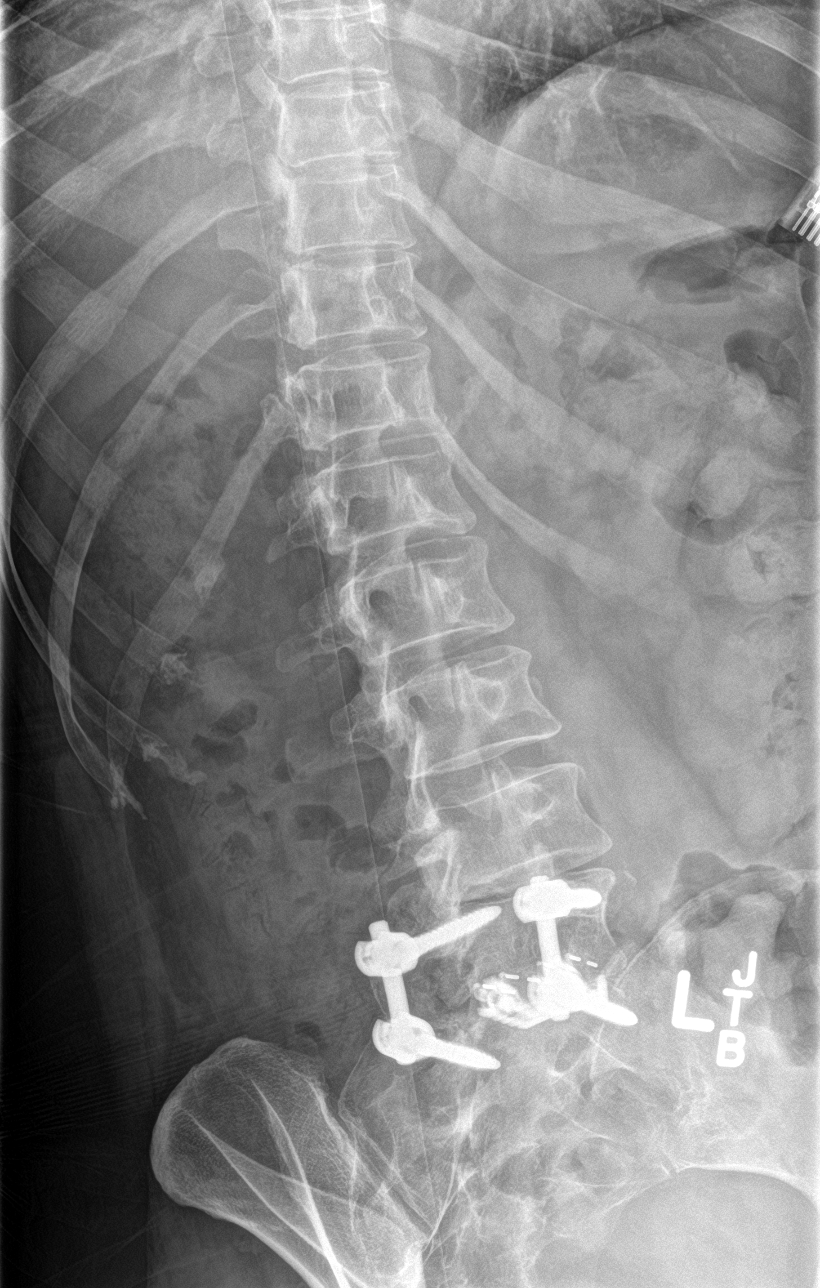

[l-spine lat]
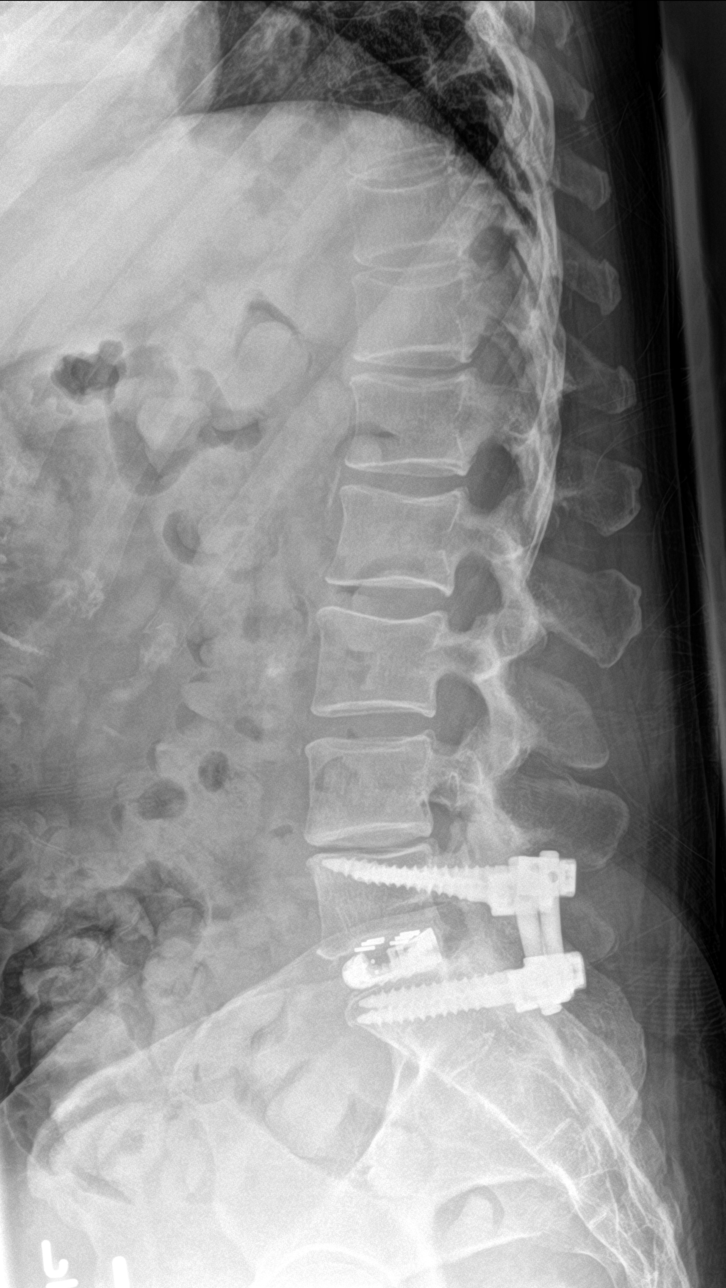

[l-spine spot]
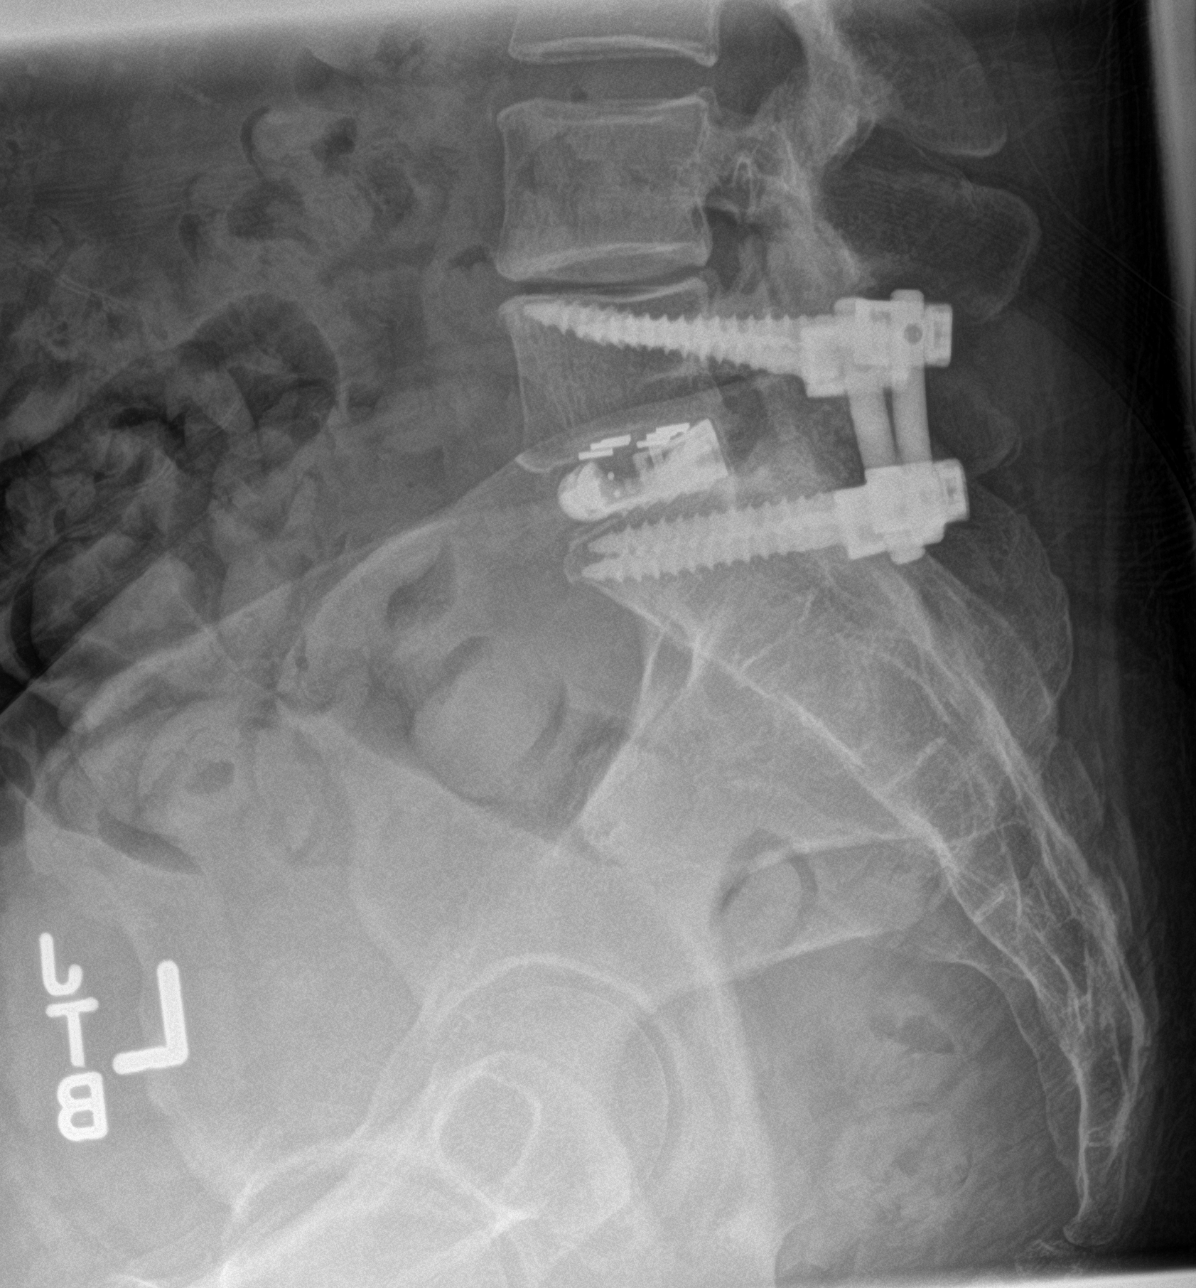

[5 of 5 positions shown; findings below may reference images not displayed]

FINDINGS: No fracture or spondylolisthesis is noted. Status post surgical
posterior fusion of L5-S1 with bilateral intrapedicular screw
placement and interbody fusion. Moderate degenerative disc disease
is noted at L4-5.
IMPRESSION: Postsurgical and degenerative changes as described above. No acute
abnormality seen.

## 2023-03-31 ENCOUNTER — Inpatient Hospital Stay: Payer: BC Managed Care – PPO | Attending: Internal Medicine | Admitting: Internal Medicine

## 2023-03-31 ENCOUNTER — Inpatient Hospital Stay: Payer: BC Managed Care – PPO

## 2023-03-31 ENCOUNTER — Encounter: Payer: Self-pay | Admitting: Internal Medicine

## 2023-03-31 VITALS — BP 148/80 | HR 68 | Temp 98.6°F | Resp 18 | Wt 142.0 lb

## 2023-03-31 DIAGNOSIS — Z7951 Long term (current) use of inhaled steroids: Secondary | ICD-10-CM | POA: Diagnosis not present

## 2023-03-31 DIAGNOSIS — Z7952 Long term (current) use of systemic steroids: Secondary | ICD-10-CM | POA: Insufficient documentation

## 2023-03-31 DIAGNOSIS — D709 Neutropenia, unspecified: Secondary | ICD-10-CM | POA: Diagnosis present

## 2023-03-31 DIAGNOSIS — M797 Fibromyalgia: Secondary | ICD-10-CM | POA: Insufficient documentation

## 2023-03-31 DIAGNOSIS — I10 Essential (primary) hypertension: Secondary | ICD-10-CM | POA: Insufficient documentation

## 2023-03-31 DIAGNOSIS — J45909 Unspecified asthma, uncomplicated: Secondary | ICD-10-CM | POA: Insufficient documentation

## 2023-03-31 DIAGNOSIS — Z79899 Other long term (current) drug therapy: Secondary | ICD-10-CM | POA: Insufficient documentation

## 2023-03-31 LAB — CBC WITH DIFFERENTIAL/PLATELET
Abs Immature Granulocytes: 0.01 10*3/uL (ref 0.00–0.07)
Basophils Absolute: 0 10*3/uL (ref 0.0–0.1)
Basophils Relative: 0 %
Eosinophils Absolute: 0 10*3/uL (ref 0.0–0.5)
Eosinophils Relative: 1 %
HCT: 36 % (ref 36.0–46.0)
Hemoglobin: 12.1 g/dL (ref 12.0–15.0)
Immature Granulocytes: 0 %
Lymphocytes Relative: 42 %
Lymphs Abs: 1.4 10*3/uL (ref 0.7–4.0)
MCH: 30.8 pg (ref 26.0–34.0)
MCHC: 33.6 g/dL (ref 30.0–36.0)
MCV: 91.6 fL (ref 80.0–100.0)
Monocytes Absolute: 0.2 10*3/uL (ref 0.1–1.0)
Monocytes Relative: 6 %
Neutro Abs: 1.7 10*3/uL (ref 1.7–7.7)
Neutrophils Relative %: 51 %
Platelets: 154 10*3/uL (ref 150–400)
RBC: 3.93 MIL/uL (ref 3.87–5.11)
RDW: 12.1 % (ref 11.5–15.5)
WBC: 3.3 10*3/uL — ABNORMAL LOW (ref 4.0–10.5)
nRBC: 0 % (ref 0.0–0.2)

## 2023-03-31 LAB — FOLATE: Folate: 30 ng/mL (ref >5.9)

## 2023-03-31 LAB — HEPATITIS B SURFACE ANTIGEN: Hepatitis B Surface Ag: NONREACTIVE

## 2023-03-31 LAB — VITAMIN B12: Vitamin B-12: 1137 pg/mL — ABNORMAL HIGH (ref 180–914)

## 2023-03-31 LAB — HEPATITIS C ANTIBODY: HCV Ab: NONREACTIVE

## 2023-03-31 LAB — HIV ANTIBODY (ROUTINE TESTING W REFLEX): HIV Screen 4th Generation wRfx: NONREACTIVE

## 2023-03-31 NOTE — Progress Notes (Signed)
 Ongoing for about 6 months now she has noticed a decrease in appetite. She gets lightheadedness with and with out excerption. She is severely fatigued she will lie down or sit and can fall asleep almost instantly.

## 2023-04-01 LAB — ANA W/REFLEX: Anti Nuclear Antibody (ANA): NEGATIVE

## 2023-04-01 LAB — HEPATITIS B CORE ANTIBODY, TOTAL: HEP B CORE AB: NEGATIVE

## 2023-04-03 LAB — MULTIPLE MYELOMA PANEL, SERUM
Albumin SerPl Elph-Mcnc: 3.8 g/dL (ref 2.9–4.4)
Albumin/Glob SerPl: 1.5 (ref 0.7–1.7)
Alpha 1: 0.3 g/dL (ref 0.0–0.4)
Alpha2 Glob SerPl Elph-Mcnc: 0.7 g/dL (ref 0.4–1.0)
B-Globulin SerPl Elph-Mcnc: 1 g/dL (ref 0.7–1.3)
Gamma Glob SerPl Elph-Mcnc: 0.7 g/dL (ref 0.4–1.8)
Globulin, Total: 2.6 g/dL (ref 2.2–3.9)
IgA: 112 mg/dL (ref 87–352)
IgG (Immunoglobin G), Serum: 699 mg/dL (ref 586–1602)
IgM (Immunoglobulin M), Srm: 54 mg/dL (ref 26–217)
Total Protein ELP: 6.4 g/dL (ref 6.0–8.5)

## 2023-04-03 LAB — COMP PANEL: LEUKEMIA/LYMPHOMA

## 2023-04-04 LAB — COPPER, SERUM: Copper: 126 ug/dL (ref 80–158)

## 2023-04-07 DIAGNOSIS — D709 Neutropenia, unspecified: Secondary | ICD-10-CM | POA: Insufficient documentation

## 2023-04-07 NOTE — Progress Notes (Signed)
 [facility]  [phone] [phone]  ID: Phyllis Park OB: 23-May-1970  MR#: [id number]  WUX#:[id number]  Patient Care Team: Marina Goodell, MD as PCP - General (Family Medicine)  REFERRING PROVIDER: Dr. Maryjane Hurter  REASON FOR REFERRAL: leukopenia  HPI: Phyllis Park is a 53 y.o. female with past medical history of hypertension, fibromyalgia, asthma referred to hematology for leukopenia.  Patient reports feeling well overall.  Denies any frequent infections. Denies any weight loss, chest pain or shortness of breath.  CBC with differential from 12/12/2022 showed WBC 3.8, ANC 1.4.  Hemoglobin 11.9 and platelet of 142.  Prior to that her white blood cell count has been normal.  Repeat levels from 01/13/2023 showed WBC 3.4 with ANC 1.1.  Hemoglobin 12.3 and platelets 159.  03/17/2023-WBC 3.3 and ANC 1.4    REVIEW OF SYSTEMS:   ROS  As per HPI. Otherwise, a complete review of systems is negative.  PAST MEDICAL HISTORY: Past Medical History:  Diagnosis Date   Asthma    Cancer (HCC)    cervical   Fibromyalgia    GERD (gastroesophageal reflux disease)    Headache    migraines   Heart murmur    never has caused any problems, no tx   Herniated lumbar intervertebral disc    Hypertension    PONV (postoperative nausea and vomiting)     PAST SURGICAL HISTORY: Past Surgical History:  Procedure Laterality Date   ABDOMINAL HYSTERECTOMY     BACK SURGERY     BILATERAL CARPAL TUNNEL RELEASE     bladder tact     COLONOSCOPY     TUBAL LIGATION      FAMILY HISTORY: Family History  Problem Relation Age of Onset   Colon cancer Mother    Heart disease Mother    Pancreatic cancer Father    High Cholesterol Father    Hypertension Sister    Breast cancer Maternal Aunt     HEALTH MAINTENANCE: Social History   Tobacco Use   Smoking status: Never   Smokeless tobacco: Never  Vaping Use   Vaping status: Never Used   Substance Use Topics   Alcohol use: Yes    Comment: occassional   Drug use: No     Allergies  Allergen Reactions   Penicillins     Has patient had a PCN reaction causing immediate rash, facial/tongue/throat swelling, SOB or lightheadedness with hypotension:Yes Has patient had a PCN reaction causing severe rash involving mucus membranes or skin necrosis:Yes Has patient had a PCN reaction that required hospitalization: No Has patient had a PCN reaction occurring within the last 10 years: No If all of the above answers are "NO", then may proceed with Cephalosporin use.    Aspirin Nausea Only and Other (See Comments)    Upset stomach   Erythromycin Nausea And Vomiting   Morphine And Codeine Other (See Comments)    Oversedated "one tablet last for 1 week"   Naproxen Nausea Only and Other (See Comments)    Current Outpatient Medications  Medication Sig Dispense Refill   AIMOVIG 140 MG/ML SOAJ Inject 140 mg into the skin every 28 (twenty-eight) days.     albuterol (VENTOLIN HFA) 108 (90 Base) MCG/ACT inhaler Inhale 2 puffs into the lungs every 4 (four) hours as needed. For wheezing or shortness of breath.     budesonide-formoterol (SYMBICORT) 160-4.5 MCG/ACT inhaler Inhale 2 puffs into the lungs 2 (two) times daily.     buprenorphine (BUTRANS) 15 MCG/HR  Place 1 patch onto the skin once a week.     buPROPion (WELLBUTRIN) 75 MG tablet Take 75 mg by mouth 2 (two) times daily.     Cyanocobalamin 5000 MCG TBDP Take 5,000 mcg by mouth in the morning and at bedtime.     diclofenac sodium (VOLTAREN) 1 % GEL Apply 2 g topically 4 (four) times daily as needed (pain).  0   esomeprazole (NEXIUM) 20 MG capsule Take 40 mg by mouth at bedtime.     finasteride (PROSCAR) 5 MG tablet Take 5 mg by mouth daily.     fluticasone (FLONASE) 50 MCG/ACT nasal spray Place 1 spray into both nostrils 2 (two) times daily.     gabapentin (NEURONTIN) 600 MG tablet Take 600 mg by mouth 3 (three) times daily.      guaiFENesin (MUCINEX) 600 MG 12 hr tablet Take 600 mg by mouth daily.     hydrochlorothiazide (HYDRODIURIL) 25 MG tablet Take 25 mg by mouth daily.     ibuprofen (ADVIL) 800 MG tablet Take 800 mg by mouth 3 (three) times daily.     lisinopril (PRINIVIL,ZESTRIL) 10 MG tablet Take 10 mg by mouth at bedtime.      metoprolol succinate (TOPROL-XL) 25 MG 24 hr tablet Take 12.5 mg by mouth daily.     oxyCODONE 10 MG TABS Take 1 tablet (10 mg total) by mouth every 3 (three) hours as needed for severe pain ((score 7 to 10)). 40 tablet 0   Potassium 99 MG TABS Take 99 mg by mouth daily.     predniSONE (STERAPRED UNI-PAK 21 TAB) 10 MG (21) TBPK tablet Take by mouth daily. Take 6 tabs by mouth daily for 1, then 5 tabs for 1 day, then 4 tabs for 1 day, then 3 tabs for 1 day, then 2 tabs for 1 day, then 1 tab for 1 day. 21 tablet 0   rosuvastatin (CRESTOR) 5 MG tablet Take 5 mg by mouth daily.     SF 5000 PLUS 1.1 % CREA dental cream Place 1 application onto teeth at bedtime.  3   tiZANidine (ZANAFLEX) 4 MG tablet Take 4 mg by mouth at bedtime.     Vitamin D, Ergocalciferol, (DRISDOL) 50000 units CAPS capsule Take 50,000 Units by mouth 2 (two) times a week.  3   vitamin E 180 MG (400 UNITS) capsule Take 400 Units by mouth in the morning and at bedtime.     No current facility-administered medications for this visit.    OBJECTIVE: Vitals:   03/31/23 1403  BP: (!) 148/80  Pulse: 68  Resp: 18  Temp: 98.6 F (37 C)  SpO2: 100%     Body mass index is 26.83 kg/m.      General: Well-developed, well-nourished, no acute distress. Eyes: Pink conjunctiva, anicteric sclera. HEENT: Normocephalic, moist mucous membranes, clear oropharnyx. Lungs: Clear to auscultation bilaterally. Heart: Regular rate and rhythm. No rubs, murmurs, or gallops. Abdomen: Soft, nontender, nondistended. No organomegaly noted, normoactive bowel sounds. Musculoskeletal: No edema, cyanosis, or clubbing. Neuro: Alert, answering all  questions appropriately. Cranial nerves grossly intact. Skin: No rashes or petechiae noted. Psych: Normal affect. Lymphatics: No cervical, calvicular, axillary or inguinal LAD.   LAB RESULTS:  Lab Results  Component Value Date   NA 140 07/27/2021   K 2.8 (L) 07/27/2021   CL 110 07/27/2021   CO2 20 (L) 07/27/2021   GLUCOSE 97 07/27/2021   BUN 10 07/27/2021   CREATININE 0.67 07/27/2021   CALCIUM 9.3  07/27/2021   GFRNONAA >60 07/27/2021   GFRAA >60 08/04/2016    Lab Results  Component Value Date   WBC 3.3 (L) 03/31/2023   NEUTROABS 1.7 03/31/2023   HGB 12.1 03/31/2023   HCT 36.0 03/31/2023   MCV 91.6 03/31/2023   PLT 154 03/31/2023    No results found for: "TIBC", "FERRITIN", "IRONPCTSAT"   STUDIES: No results found.  ASSESSMENT AND PLAN:   Phyllis Park is a 53 y.o. female with pmh of hypertension, fibromyalgia, asthma referred to hematology for leukopenia.  # Leukopenia # Neutropenia -Unknown etiology.  Noted in November 2024.  Denies any frequent infection.  Denies any new medications. -ANC has been between 1.1-1.4.  With normal hemoglobin and platelet count. -Discussed with the patient about various etiologies for neutropenia such as nutritional deficiencies, viral infection, autoimmune conditions, bone marrow related disorder.  Obtain labs as below.  We also discussed about entities such as cyclic neutropenia which is benign and can cause fluctuations in the neutrophil count.  Also discussed about ethnic neutropenia which is unlikely in her case.   Orders Placed This Encounter  Procedures   CBC with Differential/Platelet   Vitamin B12   Folate   Flow cytometry panel-leukemia/lymphoma work-up   ANA w/Reflex   Copper, serum   Multiple Myeloma Panel (SPEP&IFE w/QIG)   Hepatitis C antibody   Hepatitis B surface antigen   Hepatitis B core antibody, total   HIV ANTIBODY (ROUTINE TETSING W RELFEX)   RTC in 2 weeks via video visit to discuss  labs.  Patient expressed understanding and was in agreement with this plan. She also understands that She can call clinic at any time with any questions, concerns, or complaints.   I spent a total of 45 minutes reviewing chart data, face-to-face evaluation with the patient, counseling and coordination of care as detailed above.  Michaelyn Barter, MD   04/07/2023 2:35 PM

## 2023-04-14 ENCOUNTER — Inpatient Hospital Stay: Payer: BC Managed Care – PPO | Attending: Internal Medicine | Admitting: Internal Medicine

## 2023-04-14 DIAGNOSIS — D709 Neutropenia, unspecified: Secondary | ICD-10-CM | POA: Diagnosis not present

## 2023-04-14 NOTE — Progress Notes (Signed)
 [facility]  [phone] [phone]  I connected with Phyllis Park on 04/14/23 at  3:00 PM EST by my chart video and verified that I am speaking with the correct person using two identifiers.   I discussed the limitations, risks, security and privacy concerns of performing an evaluation and management service by telemedicine and the availability of in-person appointments. I also discussed with the patient that there may be a patient responsible charge related to this service. The patient expressed understanding and agreed to proceed.   Other persons participating in the visit and their role in the encounter: none   Patient's location: home  Provider's location: clinic   Chief Complaint: disucss labs  ID: Phyllis Park OB: [birth year]  MR#: [id number]  AOZ#:[id number]  Patient Care Team: Marina Goodell, MD as PCP - General (Family Medicine)  REFERRING PROVIDER: Dr. Maryjane Hurter  REASON FOR REFERRAL: leukopenia  HPI: Phyllis Park is a 53 y.o. female with past medical history of hypertension, fibromyalgia, asthma referred to hematology for leukopenia.  Patient reports feeling well overall.  Denies any frequent infections. Denies any weight loss, chest pain or shortness of breath.  CBC with differential from 12/12/2022 showed WBC 3.8, ANC 1.4.  Hemoglobin 11.9 and platelet of 142.  Prior to that her white blood cell count has been normal.  Repeat levels from 01/13/2023 showed WBC 3.4 with ANC 1.1.  Hemoglobin 12.3 and platelets 159.  03/17/2023-WBC 3.3 and ANC 1.4  Interval history Connected with the patient via MyChart video visit. She has been feeling well overall.  Denies any new concerns.  REVIEW OF SYSTEMS:   ROS  As per HPI. Otherwise, a complete review of systems is negative.  PAST MEDICAL HISTORY: Past Medical History:  Diagnosis Date   Asthma    Cancer (HCC)    cervical   Fibromyalgia    GERD  (gastroesophageal reflux disease)    Headache    migraines   Heart murmur    never has caused any problems, no tx   Herniated lumbar intervertebral disc    Hypertension    PONV (postoperative nausea and vomiting)     PAST SURGICAL HISTORY: Past Surgical History:  Procedure Laterality Date   ABDOMINAL HYSTERECTOMY     BACK SURGERY     BILATERAL CARPAL TUNNEL RELEASE     bladder tact     COLONOSCOPY     TUBAL LIGATION      FAMILY HISTORY: Family History  Problem Relation Age of Onset   Colon cancer Mother    Heart disease Mother    Pancreatic cancer Father    High Cholesterol Father    Hypertension Sister    Breast cancer Maternal Aunt     HEALTH MAINTENANCE: Social History   Tobacco Use   Smoking status: Never   Smokeless tobacco: Never  Vaping Use   Vaping status: Never Used  Substance Use Topics   Alcohol use: Yes    Comment: occassional   Drug use: No     Allergies  Allergen Reactions   Penicillins     Has patient had a PCN reaction causing immediate rash, facial/tongue/throat swelling, SOB or lightheadedness with hypotension:Yes Has patient had a PCN reaction causing severe rash involving mucus membranes or skin necrosis:Yes Has patient had a PCN reaction that required hospitalization: No Has patient had a PCN reaction occurring within the last 10 years: No If all of the above answers are "NO", then may proceed with Cephalosporin  use.    Aspirin Nausea Only and Other (See Comments)    Upset stomach   Erythromycin Nausea And Vomiting   Morphine And Codeine Other (See Comments)    Oversedated "one tablet last for 1 week"   Naproxen Nausea Only and Other (See Comments)    Current Outpatient Medications  Medication Sig Dispense Refill   AIMOVIG 140 MG/ML SOAJ Inject 140 mg into the skin every 28 (twenty-eight) days.     albuterol (VENTOLIN HFA) 108 (90 Base) MCG/ACT inhaler Inhale 2 puffs into the lungs every 4 (four) hours as needed. For wheezing or  shortness of breath.     budesonide-formoterol (SYMBICORT) 160-4.5 MCG/ACT inhaler Inhale 2 puffs into the lungs 2 (two) times daily.     buprenorphine (BUTRANS) 15 MCG/HR Place 1 patch onto the skin once a week.     buPROPion (WELLBUTRIN) 75 MG tablet Take 75 mg by mouth 2 (two) times daily.     Cyanocobalamin 5000 MCG TBDP Take 5,000 mcg by mouth in the morning and at bedtime.     diclofenac sodium (VOLTAREN) 1 % GEL Apply 2 g topically 4 (four) times daily as needed (pain).  0   esomeprazole (NEXIUM) 20 MG capsule Take 40 mg by mouth at bedtime.     finasteride (PROSCAR) 5 MG tablet Take 5 mg by mouth daily.     fluticasone (FLONASE) 50 MCG/ACT nasal spray Place 1 spray into both nostrils 2 (two) times daily.     gabapentin (NEURONTIN) 600 MG tablet Take 600 mg by mouth 3 (three) times daily.     guaiFENesin (MUCINEX) 600 MG 12 hr tablet Take 600 mg by mouth daily.     hydrochlorothiazide (HYDRODIURIL) 25 MG tablet Take 25 mg by mouth daily.     ibuprofen (ADVIL) 800 MG tablet Take 800 mg by mouth 3 (three) times daily.     lisinopril (PRINIVIL,ZESTRIL) 10 MG tablet Take 10 mg by mouth at bedtime.      metoprolol succinate (TOPROL-XL) 25 MG 24 hr tablet Take 12.5 mg by mouth daily.     oxyCODONE 10 MG TABS Take 1 tablet (10 mg total) by mouth every 3 (three) hours as needed for severe pain ((score 7 to 10)). 40 tablet 0   Potassium 99 MG TABS Take 99 mg by mouth daily.     predniSONE (STERAPRED UNI-PAK 21 TAB) 10 MG (21) TBPK tablet Take by mouth daily. Take 6 tabs by mouth daily for 1, then 5 tabs for 1 day, then 4 tabs for 1 day, then 3 tabs for 1 day, then 2 tabs for 1 day, then 1 tab for 1 day. 21 tablet 0   rosuvastatin (CRESTOR) 5 MG tablet Take 5 mg by mouth daily.     SF 5000 PLUS 1.1 % CREA dental cream Place 1 application onto teeth at bedtime.  3   tiZANidine (ZANAFLEX) 4 MG tablet Take 4 mg by mouth at bedtime.     Vitamin D, Ergocalciferol, (DRISDOL) 50000 units CAPS capsule Take  50,000 Units by mouth 2 (two) times a week.  3   vitamin E 180 MG (400 UNITS) capsule Take 400 Units by mouth in the morning and at bedtime.     No current facility-administered medications for this visit.    OBJECTIVE: There were no vitals filed for this visit.    There is no height or weight on file to calculate BMI.      Physical exam not performed.  Video visit.  LAB RESULTS:  Lab Results  Component Value Date   NA 140 07/27/2021   K 2.8 (L) 07/27/2021   CL 110 07/27/2021   CO2 20 (L) 07/27/2021   GLUCOSE 97 07/27/2021   BUN 10 07/27/2021   CREATININE 0.67 07/27/2021   CALCIUM 9.3 07/27/2021   GFRNONAA >60 07/27/2021   GFRAA >60 08/04/2016    Lab Results  Component Value Date   WBC 3.3 (L) 03/31/2023   NEUTROABS 1.7 03/31/2023   HGB 12.1 03/31/2023   HCT 36.0 03/31/2023   MCV 91.6 03/31/2023   PLT 154 03/31/2023    No results found for: "TIBC", "FERRITIN", "IRONPCTSAT"   STUDIES: No results found.  ASSESSMENT AND PLAN:   Phyllis Park is a 53 y.o. female with pmh of hypertension, fibromyalgia, asthma referred to hematology for leukopenia.  # Leukopenia # Neutropenia -Unknown etiology.  Noted in November 2024.  Denies any frequent infection.  -ANC has been between 1.1-1.4.  With normal hemoglobin and platelet count.  -Workup-hepatitis B/C/HIV negative.  ANA negative.  Flow cytometry with no abnormal immunophenotypic pattern.  Copper level is normal.  SPEP no M protein.  Vitamin B12 and folate normal.  TSH NL. Repeat CBC showed WBC 3.3 with normal ANC of 1.7.  Hemoglobin and platelet normal.  Medication list was reviewed and no obvious offending agents identified.  Also patient denied starting any new medications or over-the-counter supplements.  -Discussed with the patient about the negative workup and repeat ANC normalized to 1.7.  Did discuss about possibility of cyclic neutropenia which is usually a benign condition which does not increase risk of  infection and does not need intervention but will require weekly CBC check for 6 weeks to see the pattern of fluctuations in neutrophil.  Versus repeat CBC in 3 months and MD follow-up in 6 months for monitoring of the blood count.   Orders Placed This Encounter  Procedures   CBC with Differential/Platelet   CBC with Differential/Platelet   Comprehensive metabolic panel   3 months CBC only 25-month MD visit, labs.  Patient expressed understanding and was in agreement with this plan. She also understands that She can call clinic at any time with any questions, concerns, or complaints.   I spent a total of 25 minutes reviewing chart data, face-to-face evaluation with the patient, counseling and coordination of care as detailed above.  Michaelyn Barter, MD   04/14/2023 3:01 PM

## 2023-04-16 IMAGING — RF DG LUMBAR SPINE 2-3V
1 series · 2 of 2 positions shown · non-contrast
Comparison: Lumbar spine radiographs 07/06/2021

CLINICAL DATA: Lumbar interbody fusion. L4-5. Intraoperative
fluoroscopy.

EXAM:
LUMBAR SPINE - 2-3 VIEW

[Series 1: run · 2 of 2 slices shown]
[im 1/2]
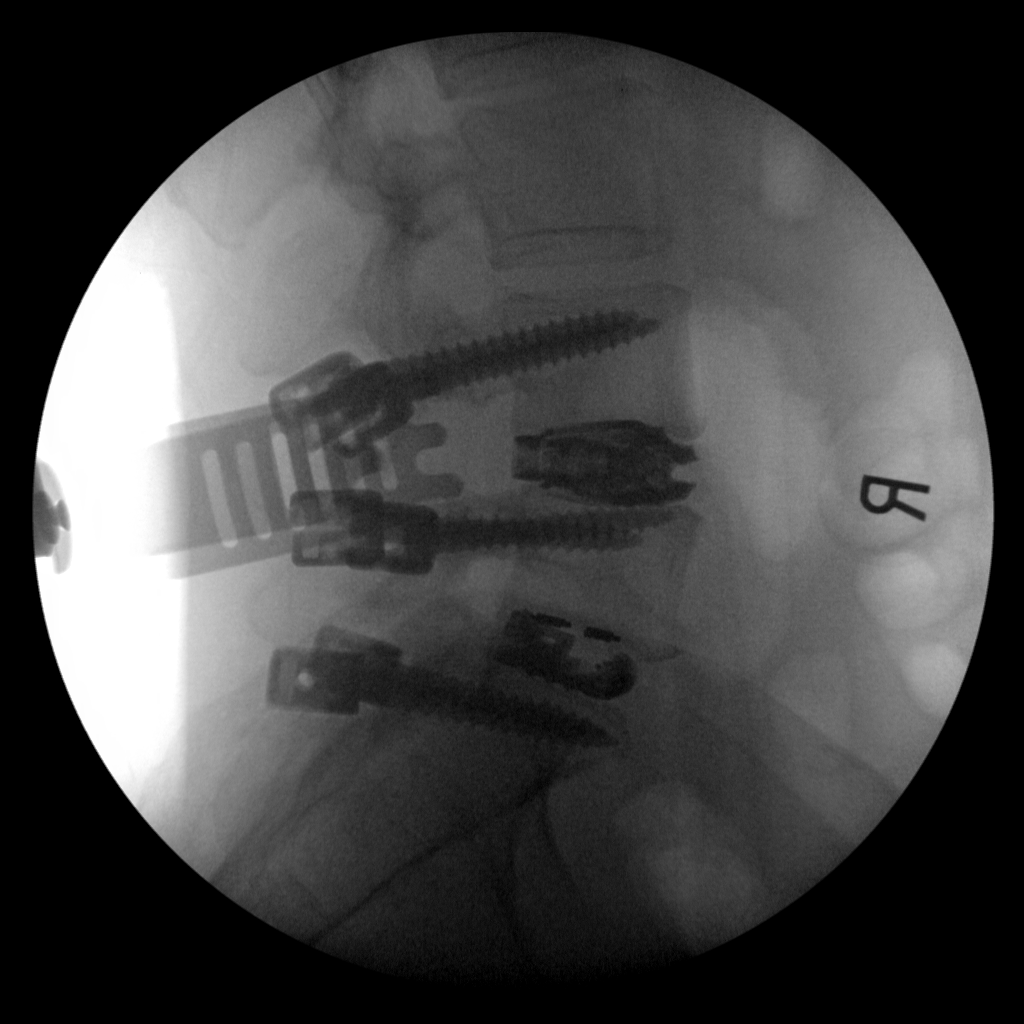
[im 2/2]
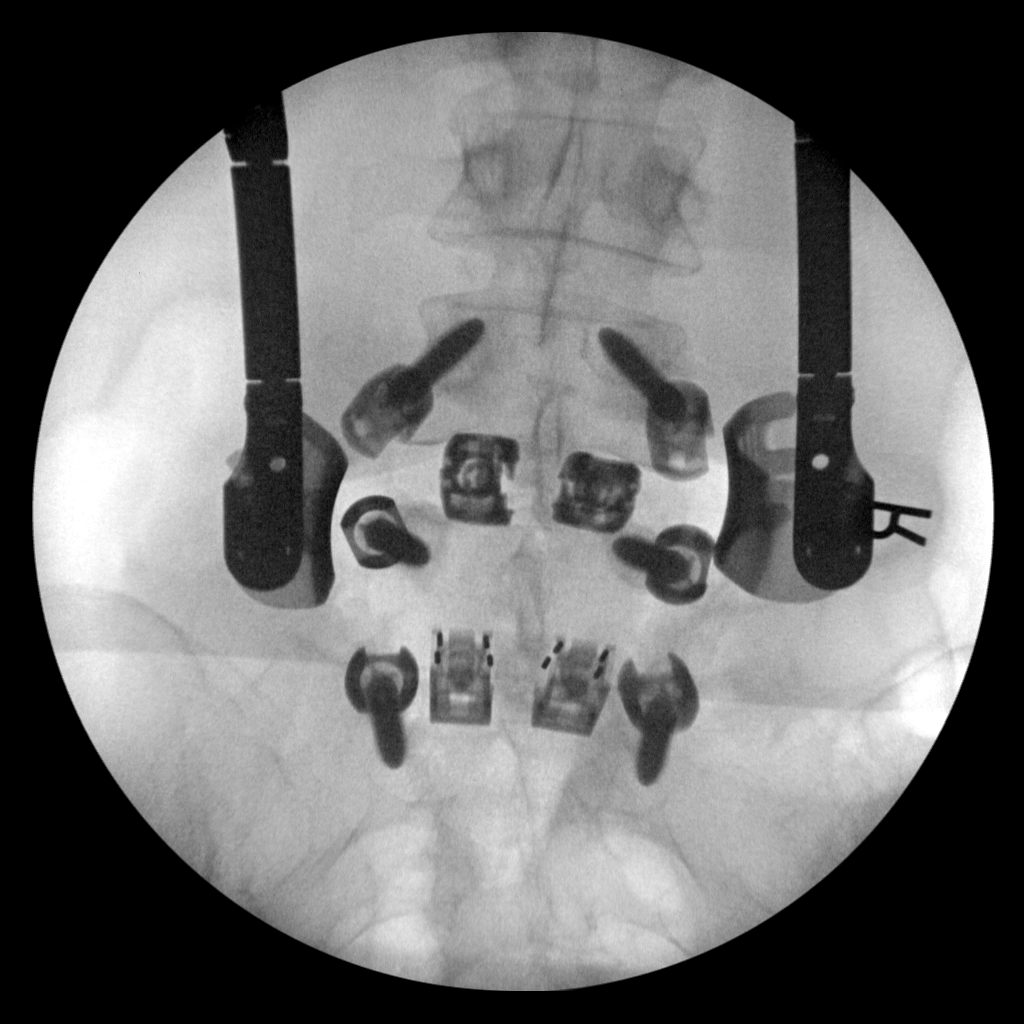

[2 of 2 positions shown; findings below may reference images not displayed]

FINDINGS: Images were performed intraoperatively without the presence of a
radiologist. Redemonstration of L5-S1 bilateral transpedicular rod
and screw fusion. There is now placement of bilateral L4
transpedicular screws and L4-5 intervertebral disc spacer. Old L5-S1
intervertebral disc spacer.

Total fluoroscopy images: 2

Total fluoroscopy time: 15 seconds

Total dose: Radiation Exposure Index (as provided by the
fluoroscopic device): 6.87 mGy air Kerma

Please see intraoperative findings for further detail.
IMPRESSION: Intraoperative fluoroscopy for revision of prior posterior
instrumented fusion, now extending L4 through S1.

## 2023-06-07 ENCOUNTER — Other Ambulatory Visit: Payer: Self-pay | Admitting: Certified Nurse Midwife

## 2023-06-07 DIAGNOSIS — Z1231 Encounter for screening mammogram for malignant neoplasm of breast: Secondary | ICD-10-CM

## 2023-09-20 ENCOUNTER — Other Ambulatory Visit: Payer: Self-pay | Admitting: Medical Genetics

## 2023-09-22 ENCOUNTER — Telehealth: Payer: Self-pay | Admitting: Oncology

## 2023-09-22 NOTE — Telephone Encounter (Signed)
 Pt calling and wants a lab and FU appt. Pt states she would like to see Dr. Georgina, next available. When can I schedule the pt and what labs to schedule? The patient was last seen on 3/27 mychart video visit with Dr. Clista.   Ph. [phone]

## 2023-09-25 ENCOUNTER — Other Ambulatory Visit: Payer: Self-pay | Admitting: *Deleted

## 2023-09-25 DIAGNOSIS — D709 Neutropenia, unspecified: Secondary | ICD-10-CM

## 2023-10-10 ENCOUNTER — Other Ambulatory Visit: Payer: Self-pay | Admitting: *Deleted

## 2023-10-10 DIAGNOSIS — D709 Neutropenia, unspecified: Secondary | ICD-10-CM

## 2023-10-11 ENCOUNTER — Inpatient Hospital Stay: Attending: Oncology

## 2023-10-11 ENCOUNTER — Encounter: Payer: Self-pay | Admitting: Oncology

## 2023-10-11 ENCOUNTER — Other Ambulatory Visit
Admission: RE | Admit: 2023-10-11 | Discharge: 2023-10-11 | Disposition: A | Discharge status: Home / Self Care | Payer: Self-pay | Source: Ambulatory Visit | Attending: Medical Genetics | Admitting: Medical Genetics

## 2023-10-11 ENCOUNTER — Inpatient Hospital Stay: Admitting: Oncology

## 2023-10-11 ENCOUNTER — Other Ambulatory Visit: Payer: Self-pay

## 2023-10-11 VITALS — BP 122/68 | HR 66 | Temp 97.8°F | Resp 18 | Wt 143.0 lb

## 2023-10-11 DIAGNOSIS — D709 Neutropenia, unspecified: Secondary | ICD-10-CM

## 2023-10-11 DIAGNOSIS — D649 Anemia, unspecified: Secondary | ICD-10-CM | POA: Insufficient documentation

## 2023-10-11 DIAGNOSIS — D72819 Decreased white blood cell count, unspecified: Secondary | ICD-10-CM

## 2023-10-11 DIAGNOSIS — J45909 Unspecified asthma, uncomplicated: Secondary | ICD-10-CM | POA: Diagnosis not present

## 2023-10-11 DIAGNOSIS — Z803 Family history of malignant neoplasm of breast: Secondary | ICD-10-CM | POA: Insufficient documentation

## 2023-10-11 DIAGNOSIS — Z800 Family history of malignant neoplasm of digestive organs: Secondary | ICD-10-CM | POA: Insufficient documentation

## 2023-10-11 DIAGNOSIS — Z7952 Long term (current) use of systemic steroids: Secondary | ICD-10-CM | POA: Insufficient documentation

## 2023-10-11 DIAGNOSIS — I10 Essential (primary) hypertension: Secondary | ICD-10-CM | POA: Insufficient documentation

## 2023-10-11 DIAGNOSIS — Z79899 Other long term (current) drug therapy: Secondary | ICD-10-CM | POA: Insufficient documentation

## 2023-10-11 DIAGNOSIS — Z7951 Long term (current) use of inhaled steroids: Secondary | ICD-10-CM | POA: Diagnosis not present

## 2023-10-11 DIAGNOSIS — D696 Thrombocytopenia, unspecified: Secondary | ICD-10-CM | POA: Insufficient documentation

## 2023-10-11 LAB — CBC WITH DIFFERENTIAL/PLATELET
Abs Immature Granulocytes: 0 K/uL (ref 0.00–0.07)
Basophils Absolute: 0 K/uL (ref 0.0–0.1)
Basophils Relative: 1 %
Eosinophils Absolute: 0 K/uL (ref 0.0–0.5)
Eosinophils Relative: 1 %
HCT: 34.7 % — ABNORMAL LOW (ref 36.0–46.0)
Hemoglobin: 11.7 g/dL — ABNORMAL LOW (ref 12.0–15.0)
Immature Granulocytes: 0 %
Lymphocytes Relative: 54 %
Lymphs Abs: 1.8 K/uL (ref 0.7–4.0)
MCH: 31.1 pg (ref 26.0–34.0)
MCHC: 33.7 g/dL (ref 30.0–36.0)
MCV: 92.3 fL (ref 80.0–100.0)
Monocytes Absolute: 0.3 K/uL (ref 0.1–1.0)
Monocytes Relative: 10 %
Neutro Abs: 1.1 K/uL — ABNORMAL LOW (ref 1.7–7.7)
Neutrophils Relative %: 34 %
Platelets: 141 K/uL — ABNORMAL LOW (ref 150–400)
RBC: 3.76 MIL/uL — ABNORMAL LOW (ref 3.87–5.11)
RDW: 12.5 % (ref 11.5–15.5)
WBC: 3.3 K/uL — ABNORMAL LOW (ref 4.0–10.5)
nRBC: 0 % (ref 0.0–0.2)

## 2023-10-11 NOTE — Progress Notes (Signed)
 [facility]  [phone] [phone]  ID: Phyllis Park OB: 1970/10/24  MR#: [id number]  RDW#:[id number]  Patient Care Team: Jeffie Cheryl BRAVO, MD as PCP - General (Family Medicine)  CHIEF COMPLAINT: Leukopenia, unspecified.  INTERVAL HISTORY: Patient returns to clinic today for repeat laboratory work and routine 55-month evaluation.  She continues to have fatigue, but otherwise feels well.  She has no neurologic complaints.  She denies any recent fevers or illnesses.  She has a good appetite and denies weight loss.  She has no chest pain, shortness of breath, cough, or hemoptysis.  She denies any nausea, vomiting, constipation, or diarrhea.  She has no urinary complaints.  Patient otherwise feels well and offers no further specific complaints today.  REVIEW OF SYSTEMS:   Review of Systems  Constitutional:  Positive for malaise/fatigue. Negative for fever and weight loss.  Respiratory: Negative.  Negative for cough, hemoptysis and shortness of breath.   Cardiovascular: Negative.  Negative for chest pain and leg swelling.  Gastrointestinal: Negative.  Negative for abdominal pain.  Genitourinary: Negative.  Negative for dysuria.  Musculoskeletal: Negative.  Negative for back pain.  Skin: Negative.  Negative for rash.  Neurological: Negative.  Negative for dizziness, focal weakness, weakness and headaches.  Psychiatric/Behavioral: Negative.  The patient is not nervous/anxious.     As per HPI. Otherwise, a complete review of systems is negative.  PAST MEDICAL HISTORY: Past Medical History:  Diagnosis Date   Asthma    Cancer (HCC)    cervical   Fibromyalgia    GERD (gastroesophageal reflux disease)    Headache    migraines   Heart murmur    never has caused any problems, no tx   Herniated lumbar intervertebral disc    Hypertension    PONV (postoperative nausea and vomiting)     PAST SURGICAL HISTORY: Past Surgical History:   Procedure Laterality Date   ABDOMINAL HYSTERECTOMY     BACK SURGERY     BILATERAL CARPAL TUNNEL RELEASE     bladder tact     COLONOSCOPY     TUBAL LIGATION      FAMILY HISTORY: Family History  Problem Relation Age of Onset   Colon cancer Mother    Heart disease Mother    Pancreatic cancer Father    High Cholesterol Father    Hypertension Sister    Breast cancer Maternal Aunt     ADVANCED DIRECTIVES (Y/N):  N  HEALTH MAINTENANCE: Social History   Tobacco Use   Smoking status: Never   Smokeless tobacco: Never  Vaping Use   Vaping status: Never Used  Substance Use Topics   Alcohol use: Yes    Comment: occassional   Drug use: No     Colonoscopy:  PAP:  Bone density:  Lipid panel:  Allergies  Allergen Reactions   Penicillins     Has patient had a PCN reaction causing immediate rash, facial/tongue/throat swelling, SOB or lightheadedness with hypotension:Yes Has patient had a PCN reaction causing severe rash involving mucus membranes or skin necrosis:Yes Has patient had a PCN reaction that required hospitalization: No Has patient had a PCN reaction occurring within the last 10 years: No If all of the above answers are NO, then may proceed with Cephalosporin use.    Aspirin Nausea Only and Other (See Comments)    Upset stomach   Erythromycin Nausea And Vomiting   Morphine And Codeine Other (See Comments)    Oversedated one tablet last for 1 week  Naproxen Nausea Only and Other (See Comments)    Current Outpatient Medications  Medication Sig Dispense Refill   AIMOVIG 140 MG/ML SOAJ Inject 140 mg into the skin every 28 (twenty-eight) days.     albuterol  (VENTOLIN  HFA) 108 (90 Base) MCG/ACT inhaler Inhale 2 puffs into the lungs every 4 (four) hours as needed. For wheezing or shortness of breath.     budesonide-formoterol  (SYMBICORT) 160-4.5 MCG/ACT inhaler Inhale 2 puffs into the lungs 2 (two) times daily.     buprenorphine (BUTRANS) 15 MCG/HR Place 1 patch  onto the skin once a week.     buPROPion  (WELLBUTRIN ) 75 MG tablet Take 75 mg by mouth 2 (two) times daily.     Cyanocobalamin  5000 MCG TBDP Take 5,000 mcg by mouth in the morning and at bedtime.     diclofenac sodium (VOLTAREN) 1 % GEL Apply 2 g topically 4 (four) times daily as needed (pain).  0   esomeprazole (NEXIUM) 20 MG capsule Take 40 mg by mouth at bedtime.     finasteride  (PROSCAR ) 5 MG tablet Take 5 mg by mouth daily.     fluticasone  (FLONASE ) 50 MCG/ACT nasal spray Place 1 spray into both nostrils 2 (two) times daily.     gabapentin  (NEURONTIN ) 600 MG tablet Take 600 mg by mouth 3 (three) times daily.     guaiFENesin  (MUCINEX ) 600 MG 12 hr tablet Take 600 mg by mouth daily.     hydrochlorothiazide  (HYDRODIURIL ) 25 MG tablet Take 25 mg by mouth daily.     ibuprofen (ADVIL) 800 MG tablet Take 800 mg by mouth 3 (three) times daily.     lisinopril  (PRINIVIL ,ZESTRIL ) 10 MG tablet Take 10 mg by mouth at bedtime.      metoprolol  succinate (TOPROL -XL) 25 MG 24 hr tablet Take 12.5 mg by mouth daily.     oxyCODONE  10 MG TABS Take 1 tablet (10 mg total) by mouth every 3 (three) hours as needed for severe pain ((score 7 to 10)). 40 tablet 0   Potassium 99 MG TABS Take 99 mg by mouth daily.     predniSONE  (STERAPRED UNI-PAK 21 TAB) 10 MG (21) TBPK tablet Take by mouth daily. Take 6 tabs by mouth daily for 1, then 5 tabs for 1 day, then 4 tabs for 1 day, then 3 tabs for 1 day, then 2 tabs for 1 day, then 1 tab for 1 day. 21 tablet 0   rosuvastatin  (CRESTOR ) 5 MG tablet Take 5 mg by mouth daily.     SF 5000 PLUS 1.1 % CREA dental cream Place 1 application onto teeth at bedtime.  3   tiZANidine  (ZANAFLEX ) 4 MG tablet Take 4 mg by mouth at bedtime.     Vitamin D , Ergocalciferol , (DRISDOL ) 50000 units CAPS capsule Take 50,000 Units by mouth 2 (two) times a week.  3   vitamin E  180 MG (400 UNITS) capsule Take 400 Units by mouth in the morning and at bedtime.     No current facility-administered  medications for this visit.    OBJECTIVE: Vitals:   10/11/23 1047 10/11/23 1051  BP: (!) 156/87 122/68  Pulse: 66   Resp: 18   Temp: 97.8 F (36.6 C)   SpO2: 96%      Body mass index is 27.02 kg/m.    ECOG FS:1 - Symptomatic but completely ambulatory  General: Well-developed, well-nourished, no acute distress. Eyes: Pink conjunctiva, anicteric sclera. HEENT: Normocephalic, moist mucous membranes. Lungs: No audible wheezing or coughing. Heart: Regular  rate and rhythm. Abdomen: Soft, nontender, no obvious distention. Musculoskeletal: No edema, cyanosis, or clubbing. Neuro: Alert, answering all questions appropriately. Cranial nerves grossly intact. Skin: No rashes or petechiae noted. Psych: Normal affect. Lymphatics: No cervical, calvicular, axillary or inguinal LAD.   LAB RESULTS:  Lab Results  Component Value Date   NA 140 07/27/2021   K 2.8 (L) 07/27/2021   CL 110 07/27/2021   CO2 20 (L) 07/27/2021   GLUCOSE 97 07/27/2021   BUN 10 07/27/2021   CREATININE 0.67 07/27/2021   CALCIUM  9.3 07/27/2021   GFRNONAA >60 07/27/2021   GFRAA >60 08/04/2016    Lab Results  Component Value Date   WBC 3.3 (L) 10/11/2023   NEUTROABS 1.1 (L) 10/11/2023   HGB 11.7 (L) 10/11/2023   HCT 34.7 (L) 10/11/2023   MCV 92.3 10/11/2023   PLT 141 (L) 10/11/2023     STUDIES: No results found.  ASSESSMENT: Leukopenia, unspecified.  PLAN:    Leukopenia, unspecified: Chronic and unchanged.  Patient's total white blood cell count remains mildly decreased at 3.3, but essentially unchanged from previous.  Previously, all of her other laboratory work was either negative or within normal limits.  Patient does not require bone marrow biopsy.  After discussion with the patient, is agreed upon that no further follow-up is necessary.  Please continue to monitor total white blood cell count once or twice per year and refer patient back if there are any concerns. Thrombocytopenia: Mild,  monitor. Anemia: Mild, monitor.  Patient expressed understanding and was in agreement with this plan. She also understands that She can call clinic at any time with any questions, concerns, or complaints.    Evalene JINNY Reusing, MD   10/11/2023 1:47 PM

## 2023-10-11 NOTE — Progress Notes (Signed)
 Patient is doing ok, no new questions for the doctor today.

## 2023-10-20 LAB — GENECONNECT MOLECULAR SCREEN: Genetic Analysis Overall Interpretation: NEGATIVE
